# Patient Record
Sex: Female | Born: 1990 | Race: White | Hispanic: No | Marital: Married | Smoking: Never smoker
Health system: Southern US, Community
[De-identification: ages and names within clinical notes are randomized; demographics above are authoritative.]

## PROBLEM LIST (undated history)

## (undated) DIAGNOSIS — Z309 Encounter for contraceptive management, unspecified: Principal | ICD-10-CM

## (undated) HISTORY — DX: Encounter for contraceptive management, unspecified: Z30.9

## (undated) HISTORY — PX: WISDOM TOOTH EXTRACTION: SHX21

---
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
## 2012-11-19 ENCOUNTER — Ambulatory Visit (INDEPENDENT_AMBULATORY_CARE_PROVIDER_SITE_OTHER): Payer: BC Managed Care – PPO | Admitting: Adult Health

## 2012-11-19 ENCOUNTER — Encounter: Payer: Self-pay | Admitting: Adult Health

## 2012-11-19 VITALS — BP 120/80 | Ht 66.0 in | Wt 158.0 lb

## 2012-11-19 DIAGNOSIS — Z309 Encounter for contraceptive management, unspecified: Secondary | ICD-10-CM

## 2012-11-19 DIAGNOSIS — Z3049 Encounter for surveillance of other contraceptives: Secondary | ICD-10-CM

## 2012-11-19 HISTORY — DX: Encounter for contraceptive management, unspecified: Z30.9

## 2012-11-19 MED ORDER — NORETHIN-ETH ESTRAD-FE BIPHAS 1 MG-10 MCG / 10 MCG PO TABS: 1.0000 | ORAL_TABLET | Freq: Every day | ORAL | Status: DC

## 2012-11-19 NOTE — Progress Notes (Signed)
Subjective:     Patient ID: Kara Simpson, female   DOB: 1990/06/14, 22 y.o.   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  Review of Systems See HPI Reviewed past medical,surgical, social and family history. Reviewed medications and allergies.     Objective:   Physical Exam BP 120/80  Ht 5\' 6"  (1.676 m)  Wt 158 lb (71.668 kg)  BMI 25.51 kg/m2  Discussed changing pills or staying on Lo Loestrin and will continue Lo Loestrin for now.  Assessment:     Contraceptive management     Plan:     Refilled Lo loestrin x 1 year Return in 6 months for pap and physical and discuss pills again then

## 2012-11-19 NOTE — Patient Instructions (Addendum)
continue lo loestrin Pap in 6 months

## 2013-05-27 ENCOUNTER — Telehealth: Payer: Self-pay | Admitting: Adult Health

## 2013-05-27 NOTE — Telephone Encounter (Signed)
Dad canceled insurance, will give samples of lo loestrin

## 2013-08-18 ENCOUNTER — Telehealth: Payer: Self-pay | Admitting: Obstetrics and Gynecology

## 2013-08-18 NOTE — Telephone Encounter (Signed)
Will give 2 samples of lo loestrin when she comes by tomorrow

## 2013-08-18 NOTE — Telephone Encounter (Signed)
Spoke with pt. Pt is not on parent's insurance now. She is on Lo Loestrin birth control. [facility] She is requesting 2 boxes of samples to last until then. Please advise. Thanks!!! [employer]

## 2015-10-30 DIAGNOSIS — Z3041 Encounter for surveillance of contraceptive pills: Secondary | ICD-10-CM | POA: Diagnosis not present

## 2015-10-30 DIAGNOSIS — Z01419 Encounter for gynecological examination (general) (routine) without abnormal findings: Secondary | ICD-10-CM | POA: Diagnosis not present

## 2015-10-30 DIAGNOSIS — Z113 Encounter for screening for infections with a predominantly sexual mode of transmission: Secondary | ICD-10-CM | POA: Diagnosis not present

## 2015-10-30 DIAGNOSIS — Z3009 Encounter for other general counseling and advice on contraception: Secondary | ICD-10-CM | POA: Diagnosis not present

## 2016-03-03 ENCOUNTER — Encounter (HOSPITAL_COMMUNITY): Payer: Self-pay | Admitting: Emergency Medicine

## 2016-03-03 ENCOUNTER — Ambulatory Visit (INDEPENDENT_AMBULATORY_CARE_PROVIDER_SITE_OTHER): Payer: 59

## 2016-03-03 ENCOUNTER — Ambulatory Visit (HOSPITAL_COMMUNITY)
Admission: EM | Admit: 2016-03-03 | Discharge: 2016-03-03 | Disposition: A | Discharge status: Home / Self Care | Payer: 59 | Attending: Family Medicine | Admitting: Family Medicine

## 2016-03-03 DIAGNOSIS — R51 Headache: Secondary | ICD-10-CM | POA: Diagnosis not present

## 2016-03-03 DIAGNOSIS — R509 Fever, unspecified: Secondary | ICD-10-CM

## 2016-03-03 DIAGNOSIS — R69 Illness, unspecified: Principal | ICD-10-CM

## 2016-03-03 DIAGNOSIS — J111 Influenza due to unidentified influenza virus with other respiratory manifestations: Secondary | ICD-10-CM

## 2016-03-03 DIAGNOSIS — M791 Myalgia: Secondary | ICD-10-CM

## 2016-03-03 DIAGNOSIS — R0982 Postnasal drip: Secondary | ICD-10-CM

## 2016-03-03 DIAGNOSIS — R110 Nausea: Secondary | ICD-10-CM

## 2016-03-03 MED ORDER — IPRATROPIUM BROMIDE 0.06 % NA SOLN: 2.0000 | Freq: Four times a day (QID) | NASAL | 1 refills | Status: DC

## 2016-03-03 MED ORDER — IBUPROFEN 800 MG PO TABS: 800.0000 mg | ORAL_TABLET | Freq: Three times a day (TID) | ORAL | 0 refills | Status: DC

## 2016-03-03 NOTE — ED Triage Notes (Signed)
PT reports fever, chills, and body aches that started Wednesday. PT denies URI symptoms and sore throat.

## 2016-03-03 NOTE — ED Provider Notes (Addendum)
[facility]    CSN: [id number] Arrival date & time: 03/03/16  1206     History   Chief Complaint Chief Complaint  Patient presents with  . Generalized Body Aches    HPI Kara Simpson is a 25 y.o. female.   The history is provided by the patient.  Influenza  Presenting symptoms: fever, myalgias, nausea and rhinorrhea   Presenting symptoms: no diarrhea, no sore throat and no vomiting   Severity:  Mild Onset quality:  Gradual Duration:  5 days Progression:  Unchanged Chronicity:  New Relieved by:  OTC medications Associated symptoms: chills, decreased appetite and nasal congestion     Past Medical History:  Diagnosis Date  . Contraceptive management 11/19/2012    Patient Active Problem List   Diagnosis Date Noted  . Contraceptive management 11/19/2012    Past Surgical History:  Procedure Laterality Date  . WISDOM TOOTH EXTRACTION      OB History    No data available       Home Medications    Prior to Admission medications   Medication Sig Start Date End Date Taking? Authorizing Provider  Norethindrone-Ethinyl Estradiol-Fe Biphas (LO LOESTRIN FE) 1 MG-10 MCG / 10 MCG tablet Take 1 tablet by mouth daily. 11/19/12  Yes Adline PotterJennifer A Griffin, NP    Family History Family History  Problem Relation Age of Onset  . Heart attack Father     Social History Social History  Substance Use Topics  . Smoking status: Never Smoker  . Smokeless tobacco: Never Used  . Alcohol use Yes     Comment: rarely     Allergies   Patient has no known allergies.   Review of Systems Review of Systems  Constitutional: Positive for chills, decreased appetite and fever.  HENT: Positive for congestion, postnasal drip and rhinorrhea. Negative for sore throat.   Respiratory: Negative.   Cardiovascular: Negative.   Gastrointestinal: Positive for nausea. Negative for diarrhea and vomiting.  Musculoskeletal: Positive for myalgias.  All other systems reviewed and  are negative.    Physical Exam Triage Vital Signs ED Triage Vitals  Enc Vitals Group     BP 03/03/16 1222 140/91     Pulse Rate 03/03/16 1222 103     Resp 03/03/16 1222 16     Temp 03/03/16 1222 100.3 F (37.9 C)     Temp Source 03/03/16 1222 Oral     SpO2 03/03/16 1222 98 %     Weight 03/03/16 1231 150 lb (68 kg)     Height 03/03/16 1231 5\' 6"  (1.676 m)     Head Circumference --      Peak Flow --      Pain Score 03/03/16 1232 5     Pain Loc --      Pain Edu? --      Excl. in GC? --    No data found.   Updated Vital Signs BP 140/91 (BP Location: Left Arm)   Pulse 103   Temp 100.3 F (37.9 C) (Oral)   Resp 16   Ht 5\' 6"  (1.676 m)   Wt 150 lb (68 kg)   LMP 03/01/2016   SpO2 98%   BMI 24.21 kg/m   Visual Acuity Right Eye Distance:   Left Eye Distance:   Bilateral Distance:    Right Eye Near:   Left Eye Near:    Bilateral Near:     Physical Exam  Constitutional: She is oriented to person, place, and time. She appears  well-developed and well-nourished. No distress.  HENT:  Right Ear: External ear normal.  Left Ear: External ear normal.  Mouth/Throat: Oropharynx is clear and moist.  Eyes: Conjunctivae are normal. Pupils are equal, round, and reactive to light.  Neck: Normal range of motion. Neck supple.  Cardiovascular: Normal rate, regular rhythm, normal heart sounds and intact distal pulses.   Pulmonary/Chest: Effort normal and breath sounds normal.  Abdominal: Soft. Bowel sounds are normal.  Lymphadenopathy:    She has no cervical adenopathy.  Neurological: She is alert and oriented to person, place, and time.  Skin: Skin is warm and dry.  Nursing note and vitals reviewed.    UC Treatments / Results  Labs (all labs ordered are listed, but only abnormal results are displayed) Labs Reviewed - No data to display  EKG  EKG Interpretation None       Radiology No results found. X-rays reviewed and report per  radiologist.  Procedures Procedures (including critical care time)  Medications Ordered in UC Medications - No data to display   Initial Impression / Assessment and Plan / UC Course  I have reviewed the triage vital signs and the nursing notes.  Pertinent labs & imaging results that were available during my care of the patient were reviewed by me and considered in my medical decision making (see chart for details).  Clinical Course       Final Clinical Impressions(s) / UC Diagnoses   Final diagnoses:  None    New Prescriptions New Prescriptions   No medications on file     Linna HoffJames D Gurshan Settlemire, MD 03/03/16 1301    Linna HoffJames D Yazeed Pryer, MD 03/06/16 2025

## 2016-03-14 ENCOUNTER — Ambulatory Visit: Payer: Self-pay | Admitting: Physician Assistant

## 2016-03-14 ENCOUNTER — Encounter: Payer: Self-pay | Admitting: Physician Assistant

## 2016-03-14 VITALS — BP 110/74 | HR 84 | Temp 97.9°F

## 2016-03-14 DIAGNOSIS — J039 Acute tonsillitis, unspecified: Secondary | ICD-10-CM

## 2016-03-14 LAB — POCT RAPID STREP A (OFFICE): Rapid Strep A Screen: NEGATIVE

## 2016-03-14 MED ORDER — METHYLPREDNISOLONE 4 MG PO TBPK: ORAL_TABLET | ORAL | 0 refills | Status: DC

## 2016-03-14 MED ORDER — AMOXICILLIN 875 MG PO TABS: 875.0000 mg | ORAL_TABLET | Freq: Two times a day (BID) | ORAL | 0 refills | Status: DC

## 2016-03-14 NOTE — Progress Notes (Signed)
S: c/o sore throat for 1 day, r tonsil is swollen and nodes are swollen, saw white patches this am, no fever/chills, no cough or congestion, did have the flu about 2 weeks ago with high fever and body aches, [facility] but they didn't swab her  O: vitals wnl, nad, tms clear, nasal mucosa wnl, throat red and swollen on r, red on left, neck supple, r tonsillar gland swollen, tender, lungs c t a, cv rrr, q strep neg  A: acute tonsillitis  P: medrol dose pack, amoxil

## 2016-11-04 DIAGNOSIS — Z3041 Encounter for surveillance of contraceptive pills: Secondary | ICD-10-CM | POA: Diagnosis not present

## 2016-11-04 DIAGNOSIS — Z01419 Encounter for gynecological examination (general) (routine) without abnormal findings: Secondary | ICD-10-CM | POA: Diagnosis not present

## 2016-11-04 DIAGNOSIS — Z3009 Encounter for other general counseling and advice on contraception: Secondary | ICD-10-CM | POA: Diagnosis not present

## 2017-01-02 DIAGNOSIS — H5213 Myopia, bilateral: Secondary | ICD-10-CM | POA: Diagnosis not present

## 2017-02-07 DIAGNOSIS — R5383 Other fatigue: Secondary | ICD-10-CM | POA: Diagnosis not present

## 2017-02-07 DIAGNOSIS — E162 Hypoglycemia, unspecified: Secondary | ICD-10-CM | POA: Diagnosis not present

## 2017-02-07 DIAGNOSIS — Z131 Encounter for screening for diabetes mellitus: Secondary | ICD-10-CM | POA: Diagnosis not present

## 2017-02-11 ENCOUNTER — Other Ambulatory Visit
Admission: RE | Admit: 2017-02-11 | Discharge: 2017-02-11 | Disposition: A | Discharge status: Home / Self Care | Payer: 59 | Source: Ambulatory Visit | Attending: Internal Medicine | Admitting: Internal Medicine

## 2017-02-11 DIAGNOSIS — Z0000 Encounter for general adult medical examination without abnormal findings: Secondary | ICD-10-CM | POA: Insufficient documentation

## 2017-02-11 LAB — URINALYSIS, ROUTINE W REFLEX MICROSCOPIC
Bacteria, UA: NONE SEEN
Bilirubin Urine: NEGATIVE
Glucose, UA: NEGATIVE mg/dL
Hgb urine dipstick: NEGATIVE
Ketones, ur: NEGATIVE mg/dL
Nitrite: NEGATIVE
Protein, ur: NEGATIVE mg/dL
Specific Gravity, Urine: 1.016 (ref 1.005–1.030)
pH: 6 (ref 5.0–8.0)

## 2017-02-11 LAB — CBC WITH DIFFERENTIAL/PLATELET
Basophils Absolute: 0 10*3/uL (ref 0–0.1)
Basophils Relative: 0 %
Eosinophils Absolute: 0.3 10*3/uL (ref 0–0.7)
Eosinophils Relative: 5 %
HCT: 42.4 % (ref 35.0–47.0)
Hemoglobin: 14.5 g/dL (ref 12.0–16.0)
Lymphocytes Relative: 24 %
Lymphs Abs: 1.3 10*3/uL (ref 1.0–3.6)
MCH: 30.2 pg (ref 26.0–34.0)
MCHC: 34.1 g/dL (ref 32.0–36.0)
MCV: 88.4 fL (ref 80.0–100.0)
Monocytes Absolute: 0.5 10*3/uL (ref 0.2–0.9)
Monocytes Relative: 8 %
Neutro Abs: 3.5 10*3/uL (ref 1.4–6.5)
Neutrophils Relative %: 63 %
Platelets: 219 10*3/uL (ref 150–440)
RBC: 4.8 MIL/uL (ref 3.80–5.20)
RDW: 12.5 % (ref 11.5–14.5)
WBC: 5.5 10*3/uL (ref 3.6–11.0)

## 2017-02-11 LAB — LIPID PANEL
Cholesterol: 148 mg/dL (ref 0–200)
HDL: 59 mg/dL (ref >40)
LDL Cholesterol: 81 mg/dL (ref 0–99)
Total CHOL/HDL Ratio: 2.5 RATIO
Triglycerides: 41 mg/dL (ref <150)
VLDL: 8 mg/dL (ref 0–40)

## 2017-02-11 LAB — COMPREHENSIVE METABOLIC PANEL
ALT: 24 U/L (ref 14–54)
AST: 20 U/L (ref 15–41)
Albumin: 4.1 g/dL (ref 3.5–5.0)
Alkaline Phosphatase: 61 U/L (ref 38–126)
Anion gap: 7 (ref 5–15)
BUN: 13 mg/dL (ref 6–20)
CO2: 23 mmol/L (ref 22–32)
Calcium: 8.9 mg/dL (ref 8.9–10.3)
Chloride: 106 mmol/L (ref 101–111)
Creatinine, Ser: 0.57 mg/dL (ref 0.44–1.00)
GFR calc Af Amer: >60 mL/min (ref >60)
GFR calc non Af Amer: >60 mL/min (ref >60)
Glucose, Bld: 87 mg/dL (ref 65–99)
Potassium: 3.8 mmol/L (ref 3.5–5.1)
Sodium: 136 mmol/L (ref 135–145)
Total Bilirubin: 0.9 mg/dL (ref 0.3–1.2)
Total Protein: 7.2 g/dL (ref 6.5–8.1)

## 2017-02-11 LAB — TSH: TSH: 2.88 u[IU]/mL (ref 0.350–4.500)

## 2017-02-12 LAB — MICROALBUMIN / CREATININE URINE RATIO
Creatinine, Urine: 103.9 mg/dL
Microalb Creat Ratio: 4.1 mg/g creat (ref 0.0–30.0)
Microalb, Ur: 4.3 ug/mL — ABNORMAL HIGH

## 2017-03-24 DIAGNOSIS — E162 Hypoglycemia, unspecified: Secondary | ICD-10-CM | POA: Diagnosis not present

## 2017-03-24 DIAGNOSIS — Z0000 Encounter for general adult medical examination without abnormal findings: Secondary | ICD-10-CM | POA: Diagnosis not present

## 2017-07-09 ENCOUNTER — Encounter: Payer: Self-pay | Admitting: Family Medicine

## 2017-07-09 ENCOUNTER — Ambulatory Visit (INDEPENDENT_AMBULATORY_CARE_PROVIDER_SITE_OTHER): Payer: Self-pay | Admitting: Family Medicine

## 2017-07-09 VITALS — BP 130/80 | HR 75 | Temp 98.2°F | Wt 171.0 lb

## 2017-07-09 DIAGNOSIS — J029 Acute pharyngitis, unspecified: Secondary | ICD-10-CM

## 2017-07-09 LAB — POCT INFLUENZA A/B
Influenza A, POC: NEGATIVE
Influenza B, POC: NEGATIVE

## 2017-07-09 LAB — POCT RAPID STREP A (OFFICE): Rapid Strep A Screen: NEGATIVE

## 2017-07-09 MED ORDER — AMOXICILLIN 875 MG PO TABS: 875.0000 mg | ORAL_TABLET | Freq: Two times a day (BID) | ORAL | 0 refills | Status: AC

## 2017-07-09 MED ORDER — FLUCONAZOLE 150 MG PO TABS: 150.0000 mg | ORAL_TABLET | Freq: Once | ORAL | 0 refills | Status: AC

## 2017-07-09 NOTE — Progress Notes (Signed)
Patient ID: Kara Simpson, female    DOB: [birth year], 27 y.o.   MRN: [id number]  PCP: Georgianne Fickamachandran, Ajith, MD  Chief Complaint  Patient presents with  . Sore Throat     Subjective:  HPI Kara MedicusLauren Strider is a 27 y.o. female presents for evaluation sore throat with associated body aches times one day. She reports  She reports associated sore throat with redness, low grade fever T-MAX 99.5, generalized body aches, headache, or fatigue. 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 She has attempted relief with a one-time dose of ibuprofen taken last night for fever. She is currently afebrile. She reports a history one year ago of a streptococcal infection which felt similar nature to the sore throat she is experiencing today. Denies any shortness of breath, wheezing, chest tightness, or cough. Social History   Socioeconomic History  . Marital status: Married    Spouse name: Not on file  . Number of children: Not on file  . Years of education: Not on file  . Highest education level: Not on file  Occupational History  . Not on file  Social Needs  . Financial resource strain: Not on file  . Food insecurity:    Worry: Not on file    Inability: Not on file  . Transportation needs:    Medical: Not on file    Non-medical: Not on file  Tobacco Use  . Smoking status: Never Smoker  . Smokeless tobacco: Never Used  Substance and Sexual Activity  . Alcohol use: Yes    Comment: rarely  . Drug use: No  . Sexual activity: Yes    Birth control/protection: Pill  Lifestyle  . Physical activity:    Days per week: Not on file    Minutes per session: Not on file  . Stress: Not on file  Relationships  . Social connections:    Talks on phone: Not on file    Gets together: Not on file    Attends religious service: Not on file    Active member of club or organization: Not on file    Attends meetings of clubs  or organizations: Not on file    Relationship status: Not on file  . Intimate partner violence:    Fear of current or ex partner: Not on file    Emotionally abused: Not on file    Physically abused: Not on file    Forced sexual activity: Not on file  Other Topics Concern  . Not on file  Social History Narrative  . Not on file    Family History  Problem Relation Age of Onset  . Heart attack Father    Review of Systems Pertinent negatives listed in HPI Patient Active Problem List   Diagnosis Date Noted  . Contraceptive management 11/19/2012    No Known Allergies  Prior to Admission medications   Medication Sig Start Date End Date Taking? Authorizing Provider  ibuprofen (ADVIL,MOTRIN) 800 MG tablet Take 1 tablet (800 mg total) by mouth 3 (three) times daily. 03/03/16  Yes Linna HoffKindl, James D, MD  VIENVA 0.1-20 MG-MCG tablet TAKE 1 TABLET BY MOUTH ONCE D 06/24/17  Yes [provider]  amoxicillin (AMOXIL) 875 MG tablet Take 1 tablet (875 mg total) by mouth 2 (two) times daily. Patient not taking: Reported on 07/09/2017 03/14/16   Faythe GheeFisher, Susan W, PA-C  methylPREDNISolone (MEDROL DOSEPAK) 4 MG TBPK tablet Take 6 pills on day  one then decrease by 1 pill each day Patient not taking: Reported on 07/09/2017 03/14/16   Faythe Ghee, PA-C  Norethindrone-Ethinyl Estradiol-Fe Biphas (LO LOESTRIN FE) 1 MG-10 MCG / 10 MCG tablet Take 1 tablet by mouth daily. Patient not taking: Reported on 07/09/2017 11/19/12   Adline Potter, NP    Past Medical, Surgical Family and Social History reviewed and updated.    Objective:   Today's Vitals   07/09/17 1136  BP: 130/80  Pulse: 75  Temp: 98.2 F (36.8 C)  SpO2: 99%  Weight: 171 lb (77.6 kg)    Wt Readings from Last 3 Encounters:  07/09/17 171 lb (77.6 kg)  03/03/16 150 lb (68 kg)  11/19/12 158 lb (71.7 kg)    Physical Exam  Constitutional: She appears well-developed and well-nourished. She has a sickly appearance.  HENT:    Head: Normocephalic and atraumatic.  Mouth/Throat: Mucous membranes are normal. Posterior oropharyngeal edema and posterior oropharyngeal erythema present.  Neck: Normal range of motion.  Cardiovascular: Normal rate and regular rhythm.  Pulmonary/Chest: Effort normal and breath sounds normal.  Lymphadenopathy:    She has no cervical adenopathy.  Skin: Skin is warm and dry.  Psychiatric: She has a normal mood and affect. Her behavior is normal. Judgment and thought content normal.      Assessment & Plan:  1. Pharyngitis, unspecified etiology, Rapid Strep negative. On exam, severe erythema with edematous posterior wall and enlarged tonsils. Will treat empirically with a broad-spectrum, Amoxicillin 875 mg BID x 5 days.   - POCT Influenza- negative    If symptoms worsen or do not improve, return for follow-up, follow-up with PCP, or at the emergency department if severity of symptoms warrant a higher level of care.    Godfrey Pick. Tiburcio Pea, MSN, FNP-C [address]  [place], [place] [address] [phone]

## 2017-07-09 NOTE — Patient Instructions (Signed)
Start Amoxicillins 875 mg BID for pharyngitis x 5 days.  Take Diflucan 150 mg once if vaginal irration occurs.     Pharyngitis Pharyngitis is redness, pain, and swelling (inflammation) of the throat (pharynx). It is a very common cause of sore throat. Pharyngitis can be caused by a bacteria, but it is usually caused by a virus. Most cases of pharyngitis get better on their own without treatment. What are the causes? This condition may be caused by:  Infection by viruses (viral). Viral pharyngitis spreads from person to person (is contagious) through coughing, sneezing, and sharing of personal items or utensils such as cups, forks, spoons, and toothbrushes.  Infection by bacteria (bacterial). Bacterial pharyngitis may be spread by touching the nose or face after coming in contact with the bacteria, or through more intimate contact, such as kissing.  Allergies. Allergies can cause buildup of mucus in the throat (post-nasal drip), leading to inflammation and irritation. Allergies can also cause blocked nasal passages, forcing breathing through the mouth, which dries and irritates the throat.  What increases the risk? You are more likely to develop this condition if:  You are 755-259 years old.  You are exposed to crowded environments such as daycare, school, or dormitory living.  You live in a cold climate.  You have a weakened disease-fighting (immune) system.  What are the signs or symptoms? Symptoms of this condition vary by the cause (viral, bacterial, or allergies) and can include:  Sore throat.  Fatigue.  Low-grade fever.  Headache.  Joint pain and muscle aches.  Skin rashes.  Swollen glands in the throat (lymph nodes).  Plaque-like film on the throat or tonsils. This is often a symptom of bacterial pharyngitis.  Vomiting.  Stuffy nose (nasal congestion).  Cough.  Red, itchy eyes (conjunctivitis).  Loss of appetite.  How is this diagnosed? This condition is  often diagnosed based on your medical history and a physical exam. Your health care provider will ask you questions about your illness and your symptoms. A swab of your throat may be done to check for bacteria (rapid strep test). Other lab tests may also be done, depending on the suspected cause, but these are rare. How is this treated? This condition usually gets better in 3-4 days without medicine. Bacterial pharyngitis may be treated with antibiotic medicines. Follow these instructions at home:  Take over-the-counter and prescription medicines only as told by your health care provider. ? If you were prescribed an antibiotic medicine, take it as told by your health care provider. Do not stop taking the antibiotic even if you start to feel better. ? Do not give children aspirin because of the association with Reye syndrome.  Drink enough water and fluids to keep your urine clear or pale yellow.  Get a lot of rest.  Gargle with a salt-water mixture 3-4 times a day or as needed. To make a salt-water mixture, completely dissolve -1 tsp of salt in 1 cup of warm water.  If your health care provider approves, you may use throat lozenges or sprays to soothe your throat. Contact a health care provider if:  You have large, tender lumps in your neck.  You have a rash.  You cough up green, yellow-brown, or bloody spit. Get help right away if:  Your neck becomes stiff.  You drool or are unable to swallow liquids.  You cannot drink or take medicines without vomiting.  You have severe pain that does not go away, even after you take medicine.  You have trouble breathing, and it is not caused by a stuffy nose.  You have new pain and swelling in your joints such as the knees, ankles, wrists, or elbows. Summary  Pharyngitis is redness, pain, and swelling (inflammation) of the throat (pharynx).  While pharyngitis can be caused by a bacteria, the most common causes are viral.  Most cases of  pharyngitis get better on their own without treatment.  Bacterial pharyngitis is treated with antibiotic medicines. This information is not intended to replace advice given to you by your health care provider. Make sure you discuss any questions you have with your health care provider. Document Released: 03/11/2005 Document Revised: 04/16/2016 Document Reviewed: 04/16/2016 Elsevier Interactive Patient Education  [employer].

## 2017-11-03 ENCOUNTER — Other Ambulatory Visit: Payer: Self-pay

## 2017-11-03 ENCOUNTER — Emergency Department (HOSPITAL_COMMUNITY)
Admission: EM | Admit: 2017-11-03 | Discharge: 2017-11-04 | Disposition: A | Discharge status: Home / Self Care | Payer: 59 | Attending: Emergency Medicine | Admitting: Emergency Medicine

## 2017-11-03 DIAGNOSIS — R509 Fever, unspecified: Secondary | ICD-10-CM | POA: Insufficient documentation

## 2017-11-03 DIAGNOSIS — R1012 Left upper quadrant pain: Secondary | ICD-10-CM | POA: Insufficient documentation

## 2017-11-03 LAB — COMPREHENSIVE METABOLIC PANEL
ALT: 14 U/L (ref 0–44)
AST: 14 U/L — ABNORMAL LOW (ref 15–41)
Albumin: 3.4 g/dL — ABNORMAL LOW (ref 3.5–5.0)
Alkaline Phosphatase: 63 U/L (ref 38–126)
Anion gap: 10 (ref 5–15)
BUN: 5 mg/dL — ABNORMAL LOW (ref 6–20)
CO2: 22 mmol/L (ref 22–32)
Calcium: 8.6 mg/dL — ABNORMAL LOW (ref 8.9–10.3)
Chloride: 108 mmol/L (ref 98–111)
Creatinine, Ser: 0.64 mg/dL (ref 0.44–1.00)
GFR calc Af Amer: >60 mL/min (ref >60)
GFR calc non Af Amer: >60 mL/min (ref >60)
Glucose, Bld: 107 mg/dL — ABNORMAL HIGH (ref 70–99)
Potassium: 3.6 mmol/L (ref 3.5–5.1)
Sodium: 140 mmol/L (ref 135–145)
Total Bilirubin: 0.4 mg/dL (ref 0.3–1.2)
Total Protein: 7.1 g/dL (ref 6.5–8.1)

## 2017-11-03 LAB — CBC
HCT: 42.6 % (ref 36.0–46.0)
Hemoglobin: 13.9 g/dL (ref 12.0–15.0)
MCH: 29 pg (ref 26.0–34.0)
MCHC: 32.6 g/dL (ref 30.0–36.0)
MCV: 88.8 fL (ref 78.0–100.0)
Platelets: 268 10*3/uL (ref 150–400)
RBC: 4.8 MIL/uL (ref 3.87–5.11)
RDW: 12 % (ref 11.5–15.5)
WBC: 10.6 10*3/uL — ABNORMAL HIGH (ref 4.0–10.5)

## 2017-11-03 LAB — I-STAT BETA HCG BLOOD, ED (MC, WL, AP ONLY): I-stat hCG, quantitative: <5 m[IU]/mL (ref <5)

## 2017-11-03 LAB — LIPASE, BLOOD: Lipase: 33 U/L (ref 11–51)

## 2017-11-03 MED ORDER — ONDANSETRON 4 MG PO TBDP: 4.0000 mg | ORAL_TABLET | Freq: Once | ORAL | Status: DC | PRN

## 2017-11-03 NOTE — ED Triage Notes (Signed)
Patient c/o n/v since Sunday. Patient also c/o being possibly exposed to an HIV patient last week while working. Patient is tearful in triage.

## 2017-11-04 ENCOUNTER — Emergency Department (HOSPITAL_COMMUNITY): Payer: 59

## 2017-11-04 DIAGNOSIS — R1012 Left upper quadrant pain: Secondary | ICD-10-CM | POA: Diagnosis not present

## 2017-11-04 DIAGNOSIS — R509 Fever, unspecified: Secondary | ICD-10-CM | POA: Diagnosis not present

## 2017-11-04 LAB — URINALYSIS, ROUTINE W REFLEX MICROSCOPIC
Bilirubin Urine: NEGATIVE
Glucose, UA: NEGATIVE mg/dL
Ketones, ur: NEGATIVE mg/dL
Nitrite: NEGATIVE
Protein, ur: NEGATIVE mg/dL
Specific Gravity, Urine: 1.014 (ref 1.005–1.030)
pH: 7 (ref 5.0–8.0)

## 2017-11-04 LAB — MONONUCLEOSIS SCREEN: Mono Screen: NEGATIVE

## 2017-11-04 MED ORDER — ACETAMINOPHEN 500 MG PO TABS
1000.0000 mg | ORAL_TABLET | Freq: Once | ORAL | Status: AC
  Administered 2017-11-04: 1000 mg via ORAL
  Filled 2017-11-04: qty 2

## 2017-11-04 MED ORDER — ONDANSETRON HCL 4 MG PO TABS: 4.0000 mg | ORAL_TABLET | Freq: Three times a day (TID) | ORAL | 0 refills | Status: DC | PRN

## 2017-11-04 MED ORDER — SODIUM CHLORIDE 0.9 % IV BOLUS
1000.0000 mL | Freq: Once | INTRAVENOUS | Status: AC
  Administered 2017-11-04: 1000 mL via INTRAVENOUS

## 2017-11-04 NOTE — ED Provider Notes (Signed)
[facility] EMERGENCY DEPARTMENT Provider Note   CSN: [id number] Arrival date & time: 11/03/17  2220     History   Chief Complaint Chief Complaint  Patient presents with  . Abdominal Pain    HPI Kara Simpson is a 27 y.o. female who presents the emergency department chief complaint of viral symptoms.  The patient has had about 3 weeks of waking up with night sweats.  Last week she developed symptoms of URI with nasal congestion, headache mild sore throat.  2 days ago the patient began having myalgias, fever at home, mild headache without neck stiffness.  She was seen by her primary care physician yesterday and had negative influenza, rapid strep,.  The patient complains persistent nausea for the past week with epigastric abdominal pain which is about a 4 out of 10 and persistent nausea with decreased appetite.  She states that the pain radiates to the left upper quadrant of her abdomen.  She denies cough, chest pain, shortness of breath, urinary symptoms, diarrhea, vomiting.  Last menstrual period was on 10/13/2017.  She denies any known tick bites.  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  She denies touching any open sores, she denies any needlestick injuries.  HPI  Past Medical History:  Diagnosis Date  . Contraceptive management 11/19/2012    Patient Active Problem List   Diagnosis Date Noted  . Contraceptive management 11/19/2012    Past Surgical History:  Procedure Laterality Date  . WISDOM TOOTH EXTRACTION       OB History   None      Home Medications    Prior to Admission medications   Medication Sig Start Date End Date Taking? Authorizing Provider  acetaminophen (TYLENOL) 325 MG tablet Take 650 mg by mouth every 6 (six) hours as needed for mild pain.   Yes [provider]  VIENVA 0.1-20 MG-MCG tablet Take 1 tablet  by mouth daily.  06/24/17  Yes [provider]  amoxicillin (AMOXIL) 875 MG tablet Take 1 tablet (875 mg total) by mouth 2 (two) times daily. Patient not taking: Reported on 07/09/2017 03/14/16   Faythe Ghee, PA-C  ibuprofen (ADVIL,MOTRIN) 800 MG tablet Take 1 tablet (800 mg total) by mouth 3 (three) times daily. Patient not taking: Reported on 11/04/2017 03/03/16   Linna Hoff, MD  methylPREDNISolone (MEDROL DOSEPAK) 4 MG TBPK tablet Take 6 pills on day one then decrease by 1 pill each day Patient not taking: Reported on 07/09/2017 03/14/16   Faythe Ghee, PA-C  Norethindrone-Ethinyl Estradiol-Fe Biphas (LO LOESTRIN FE) 1 MG-10 MCG / 10 MCG tablet Take 1 tablet by mouth daily. Patient not taking: Reported on 07/09/2017 11/19/12   Adline Potter, NP  ondansetron (ZOFRAN) 4 MG tablet Take 1 tablet (4 mg total) by mouth every 8 (eight) hours as needed for nausea or vomiting. 11/04/17   Arthor Captain, PA-C    Family History Family History  Problem Relation Age of Onset  . Heart attack Father     Social History Social History   Tobacco Use  . Smoking status: Never Smoker  . Smokeless tobacco: Never Used  Substance Use Topics  . Alcohol use: Yes    Comment: rarely  . Drug use: No     Allergies   Nickel   Review of Systems Review of Systems  Ten systems reviewed  and are negative for acute change, except as noted in the HPI.   Physical Exam Updated Vital Signs BP 110/65   Pulse 79   Temp 100.3 F (37.9 C) (Oral)   Resp 18   Ht 5\' 5"  (1.651 m)   Wt 72.6 kg   LMP 10/13/2017 (Exact Date)   SpO2 97%   BMI 26.63 kg/m   Physical Exam  Constitutional: She is oriented to person, place, and time. She appears well-developed and well-nourished. No distress.  HENT:  Head: Normocephalic and atraumatic.  Eyes: Conjunctivae are normal. No scleral icterus.  Neck: Normal range of motion.  Cardiovascular: Normal rate, regular rhythm and normal heart sounds. Exam  reveals no gallop and no friction rub.  No murmur heard. Pulmonary/Chest: Effort normal and breath sounds normal. No respiratory distress.  Abdominal: Soft. Bowel sounds are normal. She exhibits no distension and no mass. There is tenderness in the epigastric area and left upper quadrant. There is no guarding, no CVA tenderness and negative Murphy's sign.  Neurological: She is alert and oriented to person, place, and time.  Skin: Skin is warm and dry. She is not diaphoretic.  Psychiatric: Her behavior is normal.  Nursing note and vitals reviewed.    ED Treatments / Results  Labs (all labs ordered are listed, but only abnormal results are displayed) Labs Reviewed  COMPREHENSIVE METABOLIC PANEL - Abnormal; Notable for the following components:      Result Value   Glucose, Bld 107 (*)    BUN 5 (*)    Calcium 8.6 (*)    Albumin 3.4 (*)    AST 14 (*)    All other components within normal limits  CBC - Abnormal; Notable for the following components:   WBC 10.6 (*)    All other components within normal limits  URINALYSIS, ROUTINE W REFLEX MICROSCOPIC - Abnormal; Notable for the following components:   Hgb urine dipstick SMALL (*)    Leukocytes, UA MODERATE (*)    Bacteria, UA RARE (*)    All other components within normal limits  URINE CULTURE  LIPASE, BLOOD  MONONUCLEOSIS SCREEN  I-STAT BETA HCG BLOOD, ED ([facility], [facility], [facility] ONLY)    EKG None  Radiology Dg Chest 2 View  Result Date: 11/04/2017 CLINICAL DATA:  Fever. EXAM: CHEST - 2 VIEW COMPARISON:  No recent prior. FINDINGS: Mediastinum and hilar structures normal. Lungs are clear. No pleural effusion pneumothorax. Heart size normal. Thoracolumbar spine scoliosis. IMPRESSION: 1.  No acute cardiopulmonary disease. 2.  Thoracolumbar spine scoliosis. Electronically Signed   By: Maisie Fushomas  Register   On: 11/04/2017 07:12    Procedures Procedures (including critical care time)  Medications Ordered in ED Medications  acetaminophen  (TYLENOL) tablet 1,000 mg (1,000 mg Oral Given 11/04/17 0721)  sodium chloride 0.9 % bolus 1,000 mL (0 mLs Intravenous Stopped 11/04/17 0838)     Initial Impression / Assessment and Plan / ED Course  I have reviewed the triage vital signs and the nursing notes.  Pertinent labs & imaging results that were available during my care of the patient were reviewed by me and considered in my medical decision making (see chart for details).     27 year old female who presents the emergency department with viral-like illness and fever of unknown origin.  Patient's urinalysis shows moderate leukocytes and small hemoglobin with rare bacteria.  She is asymptomatic and I will not treat for urinary tract infection however I have sent her urine for culture.  Differential dx involves  infection versus autoimmune cause of fever.  I have suggested that the patient follow-up closely with her primary care physician in the outpatient setting should she continued to have fevers she may need tick titers although she has no reported embedded ticks in the past.  Patient's chest x-ray is also negative for any consolidation.  I reviewed the PA and lateral chest radiographs and agree with radiologic interpretation.  Patient feels improved after fluids and antipyretics.  She has close outpatient follow-up and feels improved to be discharged at this time.  Her lab values are reviewed.  She has a mild leukocytosis and her chemistry panel is unremarkable for significant abnormality.  I discussed return precautions with the patient who appears appropriate for discharge at this time  Final Clinical Impressions(s) / ED Diagnoses   Final diagnoses:  Fever of unknown origin (FUO)    ED Discharge Orders         Ordered    ondansetron (ZOFRAN) 4 MG tablet  Every 8 hours PRN     11/04/17 0912           Arthor CaptainHarris, Hannie Shoe, PA-C 11/04/17 1635    Zadie RhineWickline, Donald, MD 11/05/17 [phone]

## 2017-11-04 NOTE — Discharge Instructions (Addendum)
Contact a health care provider if: You vomit. You cannot eat or drink without vomiting. You have diarrhea. You have pain when you urinate. Your symptoms do not improve with treatment. You develop new symptoms. You develop excessive weakness. Get help right away if: You have shortness of breath or have trouble breathing. You are dizzy or you faint. You are disoriented or confused. You develop signs of dehydration, such as a dry mouth, decreased urination, or paleness. You develop severe pain in your abdomen. You have persistent vomiting or diarrhea. You develop a skin rash. Your symptoms suddenly get worse.

## 2017-11-04 NOTE — ED Notes (Signed)
Patient aware need for another urine specimen for culture.

## 2017-11-04 NOTE — ED Notes (Signed)
Patient transported to CT 

## 2017-11-05 LAB — URINE CULTURE: Special Requests: NORMAL

## 2017-11-13 DIAGNOSIS — Z3041 Encounter for surveillance of contraceptive pills: Secondary | ICD-10-CM | POA: Diagnosis not present

## 2017-11-13 DIAGNOSIS — Z01419 Encounter for gynecological examination (general) (routine) without abnormal findings: Secondary | ICD-10-CM | POA: Diagnosis not present

## 2018-03-02 DIAGNOSIS — Z0000 Encounter for general adult medical examination without abnormal findings: Secondary | ICD-10-CM | POA: Diagnosis not present

## 2018-11-17 DIAGNOSIS — Z01419 Encounter for gynecological examination (general) (routine) without abnormal findings: Secondary | ICD-10-CM | POA: Diagnosis not present

## 2018-11-17 DIAGNOSIS — Z3041 Encounter for surveillance of contraceptive pills: Secondary | ICD-10-CM | POA: Diagnosis not present

## 2019-03-01 DIAGNOSIS — Z0000 Encounter for general adult medical examination without abnormal findings: Secondary | ICD-10-CM | POA: Diagnosis not present

## 2019-03-08 DIAGNOSIS — Z0000 Encounter for general adult medical examination without abnormal findings: Secondary | ICD-10-CM | POA: Diagnosis not present

## 2019-03-08 DIAGNOSIS — R110 Nausea: Secondary | ICD-10-CM | POA: Diagnosis not present

## 2019-08-09 ENCOUNTER — Telehealth: Payer: 59 | Admitting: Nurse Practitioner

## 2019-08-09 DIAGNOSIS — H9201 Otalgia, right ear: Secondary | ICD-10-CM | POA: Diagnosis not present

## 2019-08-09 DIAGNOSIS — J029 Acute pharyngitis, unspecified: Secondary | ICD-10-CM | POA: Diagnosis not present

## 2019-08-09 MED ORDER — AMOXICILLIN 875 MG PO TABS: 875.0000 mg | ORAL_TABLET | Freq: Two times a day (BID) | ORAL | 0 refills | Status: DC

## 2019-08-09 NOTE — Progress Notes (Signed)
We are sorry that you are not feeling well.  Here is how we plan to help!  Based on what you have shared with me it is likely that you have pharyngitis with ear pain.  Strep pharyngitis is inflammation and infection in the back of the throat.  This is an infection cause by bacteria and is treated with antibiotics.  I have prescribed Amoxicillin 500 mg twice a day for 10 days. For throat pain, we recommend over the counter oral pain relief medications such as acetaminophen or aspirin, or anti-inflammatory medications such as ibuprofen or naproxen sodium. Topical treatments such as oral throat lozenges or sprays may be used as needed. Strep infections are not as easily transmitted as other respiratory infections, however we still recommend that you avoid close contact with loved ones, especially the very young and elderly.  Remember to wash your hands thoroughly throughout the day as this is the number one way to prevent the spread of infection and wipe down door knobs and counters with disinfectant.   Home Care:  Only take medications as instructed by your medical team.  Complete the entire course of an antibiotic.  Do not take these medications with alcohol.  A steam or ultrasonic humidifier can help congestion.  You can place a towel over your head and breathe in the steam from hot water coming from a faucet.  Avoid close contacts especially the very young and the elderly.  Cover your mouth when you cough or sneeze.  Always remember to wash your hands.  Get Help Right Away If:  You develop worsening fever or sinus pain.  You develop a severe head ache or visual changes.  Your symptoms persist after you have completed your treatment plan.  Make sure you  Understand these instructions.  Will watch your condition.  Will get help right away if you are not doing well or get worse.  Your e-visit answers were reviewed by a board certified advanced clinical practitioner to complete your  personal care plan.  Depending on the condition, your plan could have included both over the counter or prescription medications.  If there is a problem please reply  once you have received a response from your provider.  Your safety is important to [place].  If you have drug allergies check your prescription carefully.    You can use MyChart to ask questions about today's visit, request a non-urgent call back, or ask for a work or school excuse for 24 hours related to this e-Visit. If it has been greater than 24 hours you will need to follow up with your provider, or enter a new e-Visit to address those concerns.  You will get an e-mail in the next two days asking about your experience.  I hope that your e-visit has been valuable and will speed your recovery. Thank you for using e-visits.   5-10 minutes spent reviewing and documenting in chart.

## 2019-08-31 DIAGNOSIS — Z3689 Encounter for other specified antenatal screening: Secondary | ICD-10-CM | POA: Diagnosis not present

## 2019-08-31 DIAGNOSIS — Z3201 Encounter for pregnancy test, result positive: Secondary | ICD-10-CM | POA: Diagnosis not present

## 2019-08-31 LAB — OB RESULTS CONSOLE ABO/RH: RH Type: POSITIVE

## 2019-08-31 LAB — OB RESULTS CONSOLE ANTIBODY SCREEN: Antibody Screen: NEGATIVE

## 2019-09-07 DIAGNOSIS — Z3201 Encounter for pregnancy test, result positive: Secondary | ICD-10-CM | POA: Diagnosis not present

## 2019-09-14 DIAGNOSIS — Z3201 Encounter for pregnancy test, result positive: Secondary | ICD-10-CM | POA: Diagnosis not present

## 2019-10-07 DIAGNOSIS — Z118 Encounter for screening for other infectious and parasitic diseases: Secondary | ICD-10-CM | POA: Diagnosis not present

## 2019-10-07 DIAGNOSIS — Z3689 Encounter for other specified antenatal screening: Secondary | ICD-10-CM | POA: Diagnosis not present

## 2019-10-07 DIAGNOSIS — Z3401 Encounter for supervision of normal first pregnancy, first trimester: Secondary | ICD-10-CM | POA: Diagnosis not present

## 2019-10-07 LAB — OB RESULTS CONSOLE GC/CHLAMYDIA
Chlamydia: NEGATIVE
Gonorrhea: NEGATIVE

## 2019-10-07 LAB — OB RESULTS CONSOLE HIV ANTIBODY (ROUTINE TESTING): HIV: NONREACTIVE

## 2019-10-07 LAB — OB RESULTS CONSOLE RPR: RPR: NONREACTIVE

## 2019-10-07 LAB — OB RESULTS CONSOLE HEPATITIS B SURFACE ANTIGEN: Hepatitis B Surface Ag: NEGATIVE

## 2019-10-07 LAB — OB RESULTS CONSOLE RUBELLA ANTIBODY, IGM: Rubella: IMMUNE

## 2019-10-11 DIAGNOSIS — Z3401 Encounter for supervision of normal first pregnancy, first trimester: Secondary | ICD-10-CM | POA: Diagnosis not present

## 2019-10-25 DIAGNOSIS — Z3401 Encounter for supervision of normal first pregnancy, first trimester: Secondary | ICD-10-CM | POA: Diagnosis not present

## 2019-10-25 DIAGNOSIS — Z3689 Encounter for other specified antenatal screening: Secondary | ICD-10-CM | POA: Diagnosis not present

## 2019-11-07 IMAGING — CR DG CHEST 2V
2 series · 2 of 2 positions shown · non-contrast
Comparison: No recent prior.

CLINICAL DATA: Fever.

EXAM:
CHEST - 2 VIEW

[chest pa]
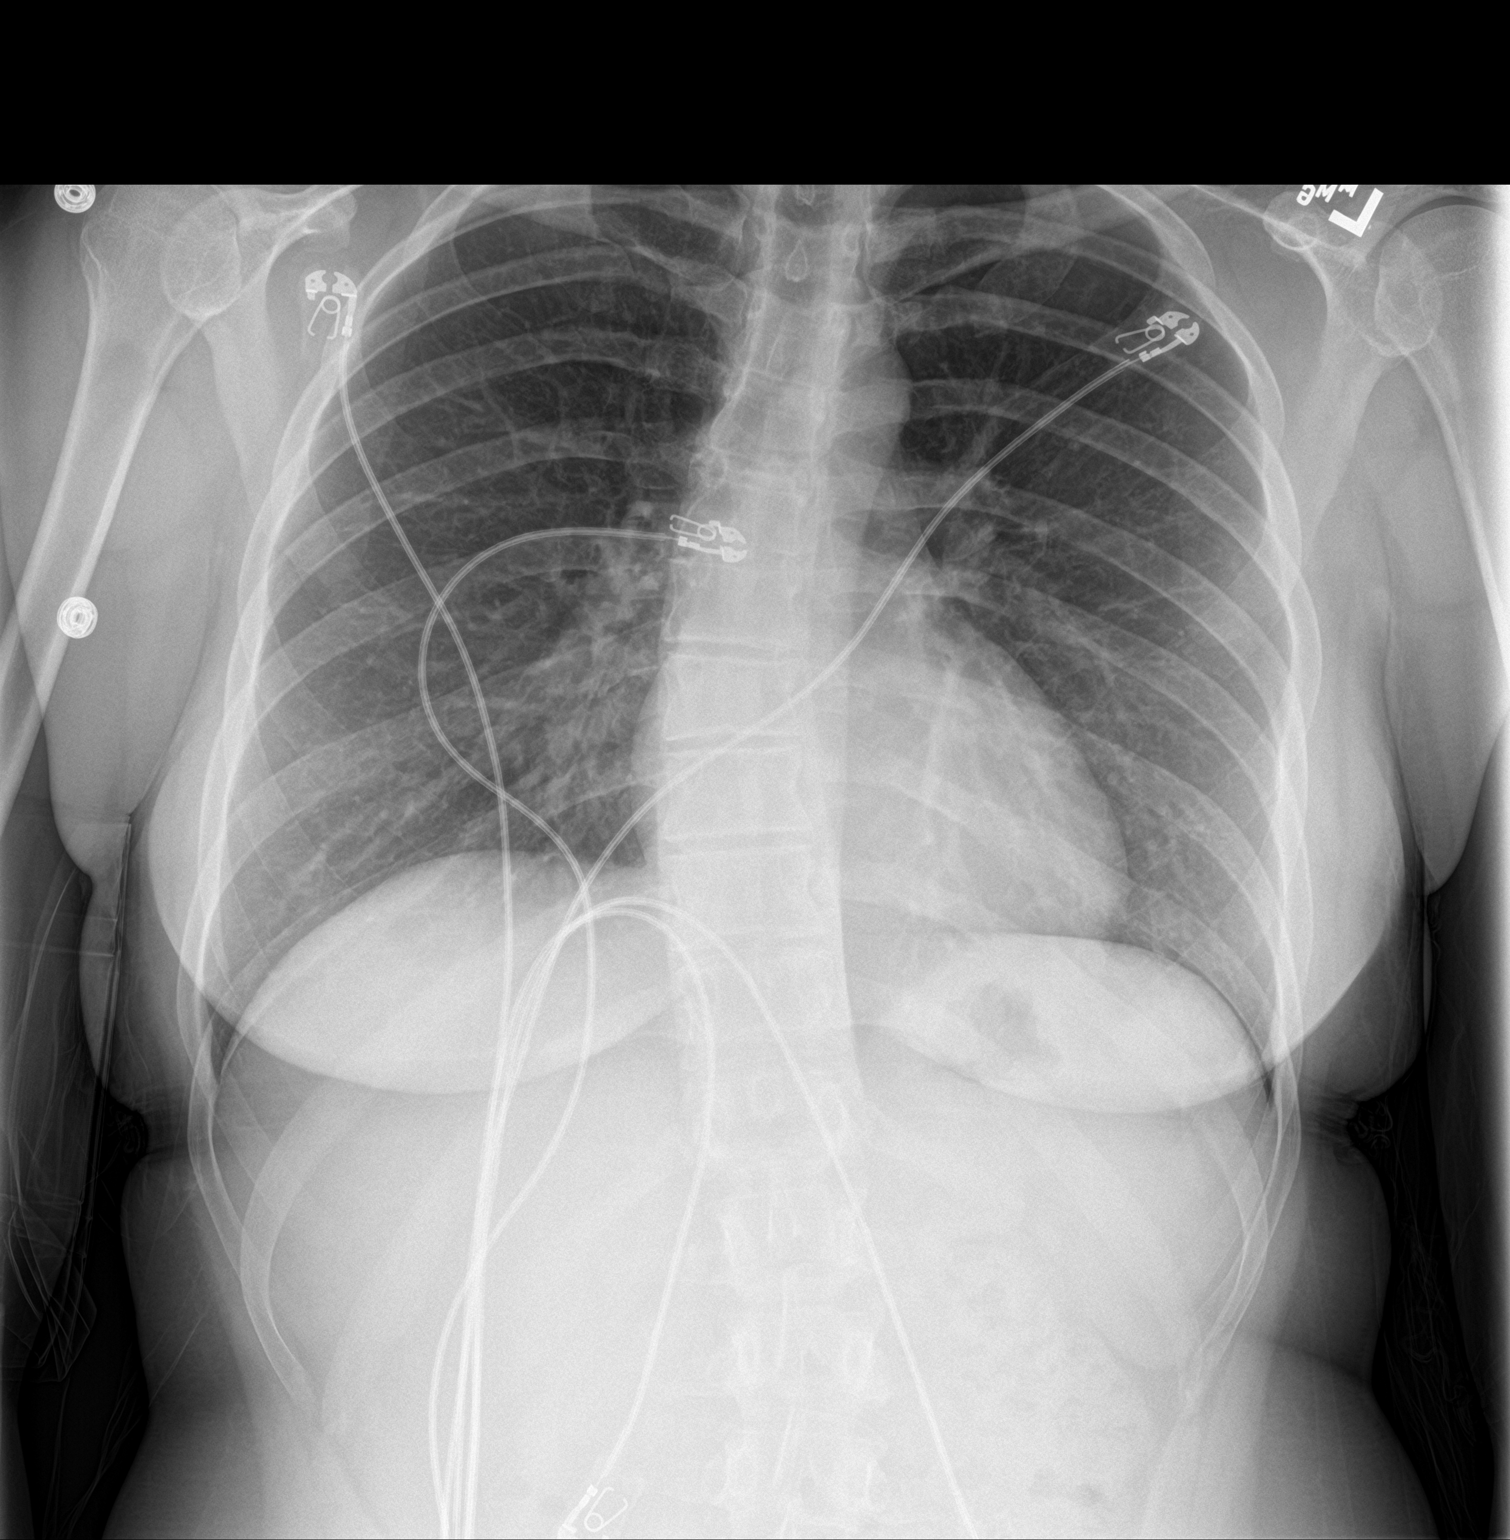

[chest lat]
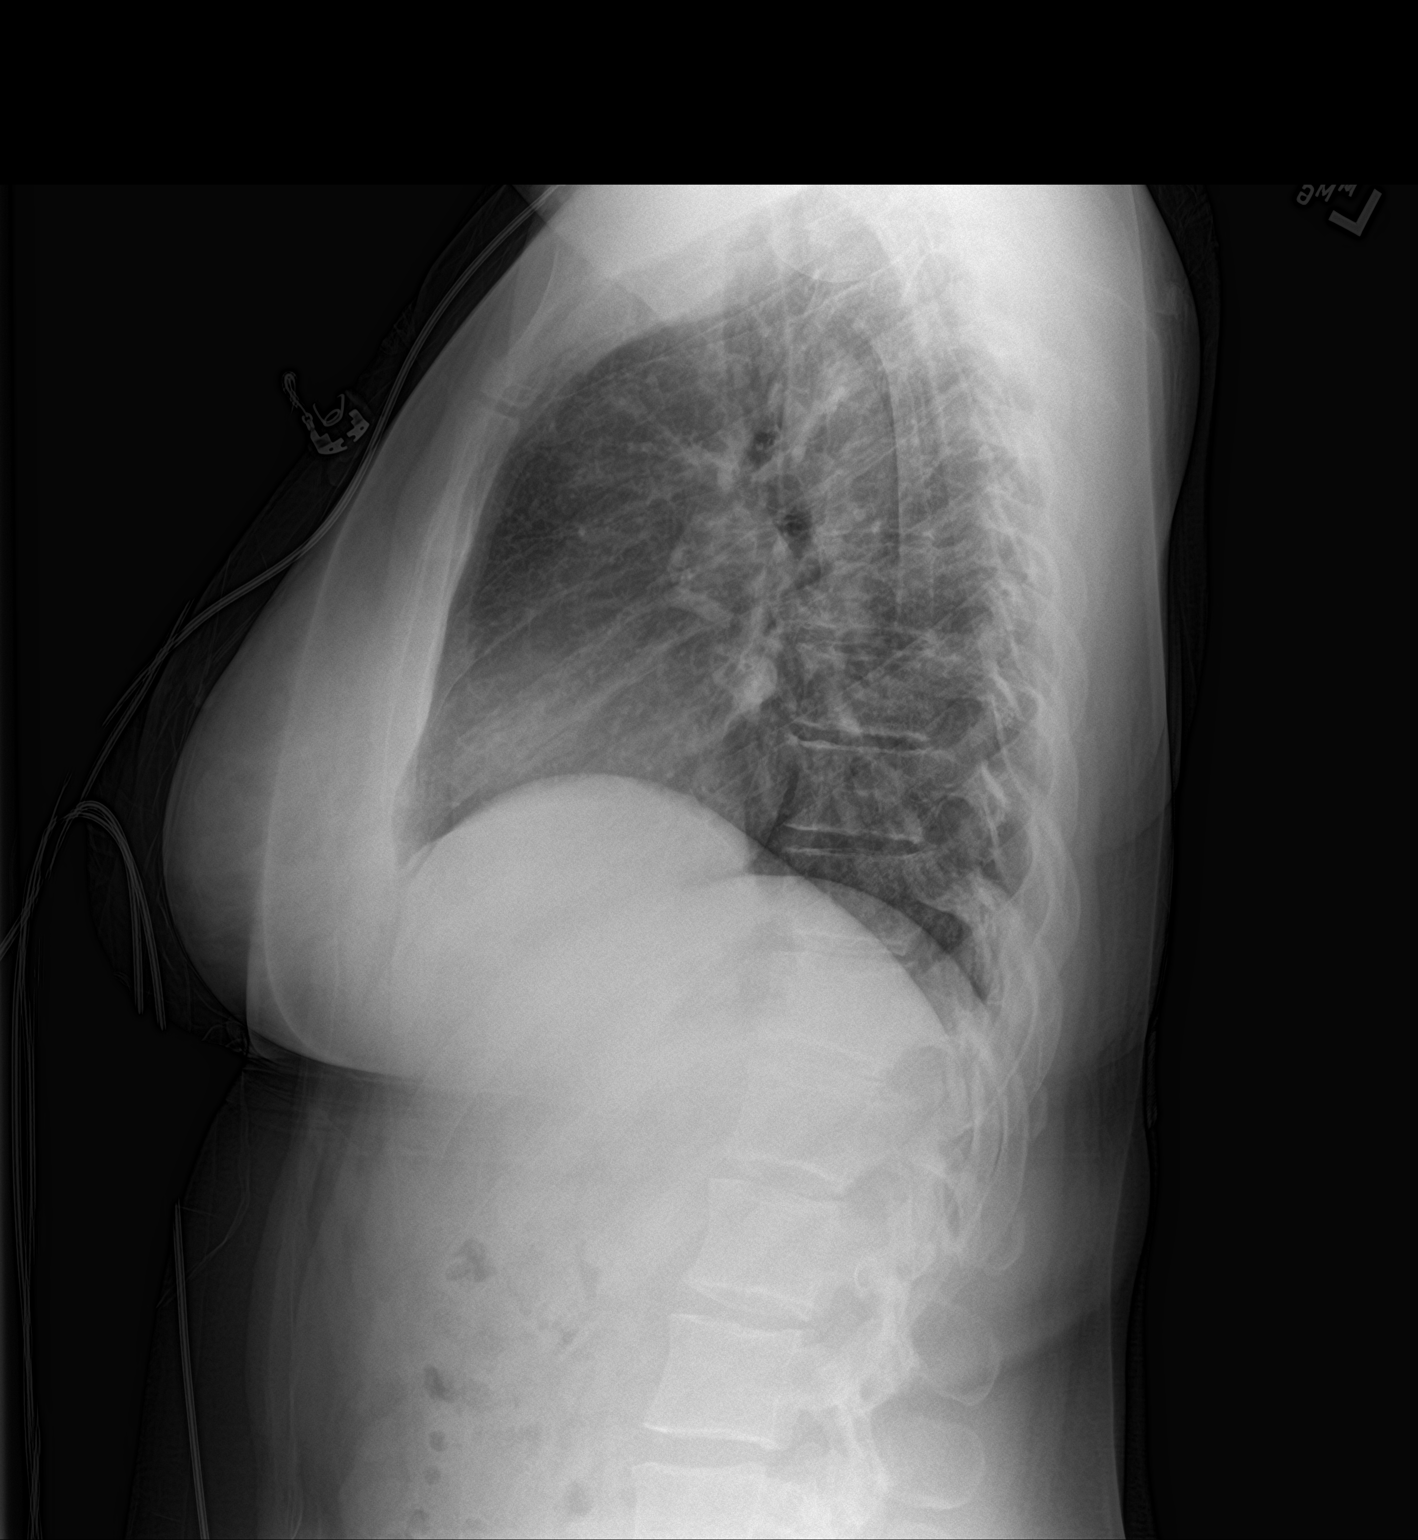

[2 of 2 positions shown; findings below may reference images not displayed]

FINDINGS: Mediastinum and hilar structures normal. Lungs are clear. No pleural
effusion pneumothorax. Heart size normal. Thoracolumbar spine
scoliosis.
IMPRESSION: 1.  No acute cardiopulmonary disease.

2.  Thoracolumbar spine scoliosis.

## 2019-11-19 DIAGNOSIS — Z361 Encounter for antenatal screening for raised alphafetoprotein level: Secondary | ICD-10-CM | POA: Diagnosis not present

## 2019-12-10 DIAGNOSIS — Z363 Encounter for antenatal screening for malformations: Secondary | ICD-10-CM | POA: Diagnosis not present

## 2019-12-30 DIAGNOSIS — Z0000 Encounter for general adult medical examination without abnormal findings: Secondary | ICD-10-CM | POA: Diagnosis not present

## 2020-02-15 DIAGNOSIS — Z3689 Encounter for other specified antenatal screening: Secondary | ICD-10-CM | POA: Diagnosis not present

## 2020-02-15 DIAGNOSIS — Z23 Encounter for immunization: Secondary | ICD-10-CM | POA: Diagnosis not present

## 2020-02-21 ENCOUNTER — Other Ambulatory Visit: Payer: Self-pay | Admitting: Obstetrics & Gynecology

## 2020-02-21 DIAGNOSIS — Z3A29 29 weeks gestation of pregnancy: Secondary | ICD-10-CM | POA: Diagnosis not present

## 2020-02-21 DIAGNOSIS — O99810 Abnormal glucose complicating pregnancy: Secondary | ICD-10-CM | POA: Diagnosis not present

## 2020-02-24 DIAGNOSIS — Z3400 Encounter for supervision of normal first pregnancy, unspecified trimester: Secondary | ICD-10-CM | POA: Diagnosis not present

## 2020-03-01 ENCOUNTER — Encounter: Payer: 59 | Attending: Obstetrics & Gynecology | Admitting: Registered"

## 2020-03-01 ENCOUNTER — Other Ambulatory Visit: Payer: Self-pay

## 2020-03-01 DIAGNOSIS — O24419 Gestational diabetes mellitus in pregnancy, unspecified control: Secondary | ICD-10-CM

## 2020-03-01 NOTE — Progress Notes (Signed)
Patient was seen on 03/01/20 for Gestational Diabetes self-management class at the [facility]. The following learning objectives were met by the patient during this course:   States the definition of Gestational Diabetes  States why dietary management is important in controlling blood glucose  Describes the effects each nutrient has on blood glucose levels  Demonstrates ability to create a balanced meal plan  Demonstrates carbohydrate counting   States when to check blood glucose levels  Demonstrates proper blood glucose monitoring techniques  States the effect of stress and exercise on blood glucose levels  States the importance of limiting caffeine and abstaining from alcohol and smoking  Blood glucose monitor given: none. Patient has meter and is checking prior to class   Patient instructed to monitor glucose levels: FBS: 60 - <95; 1 hour: <140; 2 hour: <120  Patient received handouts:  Nutrition Diabetes and Pregnancy, including carb counting list  Patient will be seen for follow-up as needed.

## 2020-03-06 ENCOUNTER — Encounter: Payer: Self-pay | Admitting: Registered"

## 2020-03-06 DIAGNOSIS — O24419 Gestational diabetes mellitus in pregnancy, unspecified control: Secondary | ICD-10-CM | POA: Insufficient documentation

## 2020-03-15 DIAGNOSIS — Z3403 Encounter for supervision of normal first pregnancy, third trimester: Secondary | ICD-10-CM | POA: Diagnosis not present

## 2020-03-15 DIAGNOSIS — O99810 Abnormal glucose complicating pregnancy: Secondary | ICD-10-CM | POA: Diagnosis not present

## 2020-03-15 DIAGNOSIS — Z3A33 33 weeks gestation of pregnancy: Secondary | ICD-10-CM | POA: Diagnosis not present

## 2020-03-25 NOTE — L&D Delivery Note (Signed)
Delivery Note At 1:16 AM a viable and healthy female was delivered via Vaginal, Spontaneous (Presentation: Left Occiput Anterior, restituted to direct OP).  APGAR: 9, 9; weight pending .   Placenta status: Spontaneous, Intact.  Cord: 3 vessels with the following complications: None.  Cord pH: NA  Anesthesia: Epidural Episiotomy: None Lacerations: Labial;Vaginal Suture Repair: 3.0 vicryl rapide Est. Blood Loss (mL): 250  Mom to postpartum.  Baby to Couplet care / Skin to Skin.  Robley Fries 05/01/2020, 2:02 AM

## 2020-03-29 LAB — OB RESULTS CONSOLE GBS: GBS: NEGATIVE

## 2020-04-28 ENCOUNTER — Other Ambulatory Visit: Payer: Self-pay | Admitting: Obstetrics & Gynecology

## 2020-04-29 ENCOUNTER — Encounter (HOSPITAL_COMMUNITY): Payer: Self-pay | Admitting: *Deleted

## 2020-04-30 ENCOUNTER — Inpatient Hospital Stay (HOSPITAL_COMMUNITY): Payer: No Typology Code available for payment source | Admitting: Anesthesiology

## 2020-04-30 ENCOUNTER — Inpatient Hospital Stay (HOSPITAL_COMMUNITY): Payer: No Typology Code available for payment source

## 2020-04-30 ENCOUNTER — Encounter (HOSPITAL_COMMUNITY): Payer: Self-pay | Admitting: Obstetrics & Gynecology

## 2020-04-30 ENCOUNTER — Inpatient Hospital Stay (HOSPITAL_COMMUNITY)
Admission: AD | Admit: 2020-04-30 | Discharge: 2020-05-03 | DRG: 807 | Disposition: A | Discharge status: Home / Self Care | Payer: No Typology Code available for payment source | Attending: Obstetrics & Gynecology | Admitting: Obstetrics & Gynecology

## 2020-04-30 ENCOUNTER — Other Ambulatory Visit: Payer: Self-pay

## 2020-04-30 DIAGNOSIS — Z20822 Contact with and (suspected) exposure to covid-19: Secondary | ICD-10-CM | POA: Diagnosis present

## 2020-04-30 DIAGNOSIS — O24419 Gestational diabetes mellitus in pregnancy, unspecified control: Secondary | ICD-10-CM | POA: Diagnosis present

## 2020-04-30 DIAGNOSIS — Z3A39 39 weeks gestation of pregnancy: Secondary | ICD-10-CM | POA: Diagnosis not present

## 2020-04-30 DIAGNOSIS — O24420 Gestational diabetes mellitus in childbirth, diet controlled: Principal | ICD-10-CM | POA: Diagnosis present

## 2020-04-30 DIAGNOSIS — O24410 Gestational diabetes mellitus in pregnancy, diet controlled: Secondary | ICD-10-CM | POA: Diagnosis present

## 2020-04-30 DIAGNOSIS — Z3490 Encounter for supervision of normal pregnancy, unspecified, unspecified trimester: Secondary | ICD-10-CM | POA: Diagnosis present

## 2020-04-30 LAB — CBC
HCT: 40.9 % (ref 36.0–46.0)
Hemoglobin: 13.4 g/dL (ref 12.0–15.0)
MCH: 28.4 pg (ref 26.0–34.0)
MCHC: 32.8 g/dL (ref 30.0–36.0)
MCV: 86.7 fL (ref 80.0–100.0)
Platelets: 251 10*3/uL (ref 150–400)
RBC: 4.72 MIL/uL (ref 3.87–5.11)
RDW: 13.3 % (ref 11.5–15.5)
WBC: 8.8 10*3/uL (ref 4.0–10.5)
nRBC: 0 % (ref 0.0–0.2)

## 2020-04-30 LAB — TYPE AND SCREEN
ABO/RH(D): O POS
Antibody Screen: NEGATIVE

## 2020-04-30 LAB — RPR: RPR Ser Ql: NONREACTIVE

## 2020-04-30 LAB — GLUCOSE, CAPILLARY
Glucose-Capillary: 100 mg/dL — ABNORMAL HIGH (ref 70–99)
Glucose-Capillary: 62 mg/dL — ABNORMAL LOW (ref 70–99)
Glucose-Capillary: 84 mg/dL (ref 70–99)
Glucose-Capillary: 70 mg/dL (ref 70–99)
Glucose-Capillary: 68 mg/dL — ABNORMAL LOW (ref 70–99)

## 2020-04-30 LAB — SARS CORONAVIRUS 2 BY RT PCR (HOSPITAL ORDER, PERFORMED IN ~~LOC~~ HOSPITAL LAB): SARS Coronavirus 2: NEGATIVE

## 2020-04-30 MED ORDER — LIDOCAINE HCL (PF) 1 % IJ SOLN: 30.0000 mL | INTRAMUSCULAR | Status: DC | PRN

## 2020-04-30 MED ORDER — LACTATED RINGERS IV SOLN: INTRAVENOUS | Status: DC

## 2020-04-30 MED ORDER — OXYTOCIN-SODIUM CHLORIDE 30-0.9 UT/500ML-% IV SOLN
1.0000 m[IU]/min | INTRAVENOUS | Status: DC
  Administered 2020-04-30: 2 m[IU]/min via INTRAVENOUS
  Filled 2020-04-30: qty 500

## 2020-04-30 MED ORDER — FENTANYL-BUPIVACAINE-NACL 0.5-0.125-0.9 MG/250ML-% EP SOLN
12.0000 mL/h | EPIDURAL | Status: DC | PRN
Start: 2020-04-30 — End: 2020-05-01
  Administered 2020-04-30: 12 mL/h via EPIDURAL
  Filled 2020-04-30: qty 250

## 2020-04-30 MED ORDER — OXYTOCIN BOLUS FROM INFUSION
333.0000 mL | Freq: Once | INTRAVENOUS | Status: AC
  Administered 2020-05-01: 333 mL via INTRAVENOUS

## 2020-04-30 MED ORDER — TRANEXAMIC ACID-NACL 1000-0.7 MG/100ML-% IV SOLN
INTRAVENOUS | Status: AC
  Filled 2020-04-30: qty 100

## 2020-04-30 MED ORDER — PHENYLEPHRINE 40 MCG/ML (10ML) SYRINGE FOR IV PUSH (FOR BLOOD PRESSURE SUPPORT): 80.0000 ug | PREFILLED_SYRINGE | INTRAVENOUS | Status: DC | PRN

## 2020-04-30 MED ORDER — LIDOCAINE-EPINEPHRINE (PF) 2 %-1:200000 IJ SOLN
INTRAMUSCULAR | Status: DC | PRN
  Administered 2020-04-30: 5 mL via EPIDURAL

## 2020-04-30 MED ORDER — OXYTOCIN-SODIUM CHLORIDE 30-0.9 UT/500ML-% IV SOLN
2.5000 [IU]/h | INTRAVENOUS | Status: DC
  Filled 2020-04-30: qty 500

## 2020-04-30 MED ORDER — EPHEDRINE 5 MG/ML INJ: 10.0000 mg | INTRAVENOUS | Status: DC | PRN

## 2020-04-30 MED ORDER — LACTATED RINGERS IV SOLN
500.0000 mL | Freq: Once | INTRAVENOUS | Status: AC
  Administered 2020-04-30: 500 mL via INTRAVENOUS

## 2020-04-30 MED ORDER — ONDANSETRON HCL 4 MG/2ML IJ SOLN: 4.0000 mg | Freq: Four times a day (QID) | INTRAMUSCULAR | Status: DC | PRN

## 2020-04-30 MED ORDER — TERBUTALINE SULFATE 1 MG/ML IJ SOLN: 0.2500 mg | Freq: Once | INTRAMUSCULAR | Status: DC | PRN

## 2020-04-30 MED ORDER — LACTATED RINGERS IV SOLN: 500.0000 mL | INTRAVENOUS | Status: DC | PRN

## 2020-04-30 MED ORDER — SOD CITRATE-CITRIC ACID 500-334 MG/5ML PO SOLN: 30.0000 mL | ORAL | Status: DC | PRN

## 2020-04-30 MED ORDER — ACETAMINOPHEN 325 MG PO TABS
650.0000 mg | ORAL_TABLET | ORAL | Status: DC | PRN
  Administered 2020-04-30: 650 mg via ORAL
  Filled 2020-04-30: qty 2

## 2020-04-30 MED ORDER — DIPHENHYDRAMINE HCL 50 MG/ML IJ SOLN: 12.5000 mg | INTRAMUSCULAR | Status: DC | PRN

## 2020-04-30 NOTE — Anesthesia Procedure Notes (Signed)
Epidural Patient location during procedure: OB Start time: 04/30/2020 6:55 PM End time: 04/30/2020 7:05 PM  Staffing Anesthesiologist: Elmer Picker, MD Performed: anesthesiologist   Preanesthetic Checklist Completed: patient identified, IV checked, risks and benefits discussed, monitors and equipment checked, pre-op evaluation and timeout performed  Epidural Patient position: sitting Prep: DuraPrep and site prepped and draped Patient monitoring: continuous pulse ox, blood pressure, heart rate and cardiac monitor Approach: midline Location: L3-L4 Injection technique: LOR air  Needle:  Needle type: Tuohy  Needle gauge: 17 G Needle length: 9 cm Needle insertion depth: 6 cm Catheter type: closed end flexible Catheter size: 19 Gauge Catheter at skin depth: 12 cm Test dose: negative  Assessment Sensory level: T8 Events: blood not aspirated, injection not painful, no injection resistance, no paresthesia and negative IV test  Additional Notes Patient identified. Risks/Benefits/Options discussed with patient including but not limited to bleeding, infection, nerve damage, paralysis, failed block, incomplete pain control, headache, blood pressure changes, nausea, vomiting, reactions to medication both or allergic, itching and postpartum back pain. Confirmed with bedside nurse the patient's most recent platelet count. Confirmed with patient that they are not currently taking any anticoagulation, have any bleeding history or any family history of bleeding disorders. Patient expressed understanding and wished to proceed. All questions were answered. Sterile technique was used throughout the entire procedure. Please see nursing notes for vital signs. Test dose was given through epidural catheter and negative prior to continuing to dose epidural or start infusion. Warning signs of high block given to the patient including shortness of breath, tingling/numbness in hands, complete motor block, or  any concerning symptoms with instructions to call for help. Patient was given instructions on fall risk and not to get out of bed. All questions and concerns addressed with instructions to call with any issues or inadequate analgesia.  Reason for block:procedure for pain

## 2020-04-30 NOTE — Progress Notes (Addendum)
IOL for GDM  39.4 wks S/p Cervical foley and pitocin at admission. Foley expelled and AROM at 3.45 p, with -3 station. Now s/p epidural and resting.   BP 115/60   Pulse 76   Temp 98.2 F (36.8 C) (Oral)   Resp 16   Ht 5\' 5"  (1.651 m)   Wt 86.1 kg   BMI 31.60 kg/m  FHT 140s + accels no decels mod variab- cat I  Toco q 2-3 min SVE 6/50%/-3 Vx. Head not well applied to cervix.   CBG reviewed, some lows, drinking juice as needed Pelvis borderline. Assess rotation and descent, trying lateral positions with Exaggerated sims, plan to use Peanut ball if further descent noted.   MD

## 2020-04-30 NOTE — Progress Notes (Signed)
CBG resulted 68.  Treatment: cranberry juice No symptoms

## 2020-04-30 NOTE — H&P (Signed)
Kara Simpson is a 30 y.o. female presenting for IOL at 39.4 wks for A1GDM, suspected LGA.  PNcare from 8 wks. Healthy female, 27 lb wt gain, GDM-A1, at 30 wks, well controlled. Growth - 33 wks - 4'14" 56%, AC 85%, BPD91%, AFI nl, Vx.  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 Contraceptive management 11/19/2012   Past Surgical History:  Procedure Laterality Date  . WISDOM TOOTH EXTRACTION     Family History: family history includes Heart attack in her father. Social History:  reports that she has never smoked. She has never used smokeless tobacco. She reports current alcohol use. She reports that she does not use drugs.     Maternal Diabetes: Yes:  Diabetes Type:  Diet controlled Genetic Screening: Normal NIPS, XX Maternal Ultrasounds/Referrals: Normal Fetal Ultrasounds or other Referrals:  None Maternal Substance Abuse:  No Significant Maternal Medications:  None Significant Maternal Lab Results:  Group B Strep negative Other Comments:  None  Review of Systems History Dilation: 2 Effacement (%): 50 Station: -2 Exam by:: Catie Futures trader Blood pressure 118/63, pulse 71, temperature 98.4 F (36.9 C), temperature source Oral, resp. rate 14, height 5\' 5"  (1.651 m), weight 86.1 kg. Exam Physical Exam  A&O x 3, no acute distress. Pleasant HEENT neg, no thyromegaly Lungs CTA bilat CV RRR, S1S2 normal Abdo soft, non tender, non acute Extr no edema/ tenderness Pelvic above FHT 1450-150/ mod variability, + accels, no decels - cat I Toco irreg   Prenatal labs: ABO, Rh: --/--/O POS (02/06 12-14-1992) Antibody: NEG (02/06 0748) Rubella: Immune (07/15 0000) RPR: Nonreactive (07/15 0000)  HBsAg: Negative (07/15 0000)  HIV: Non-reactive (07/15 0000)   GBS: Negative/-- (01/05 0000)  NIPS- [employer]- low risk, XX AFP1 nl GTT abn   Assessment/Plan: 30 yo G1 at 39.4 wks, here for IOL for GDM A1. EFW 7.1/2- 8 lbs. FHT cat I GBS(-) Watch high station (BPD 91% at 33 wks) CBG q 4 hrs  MOD- working towards vaginal delivery  26 04/30/2020, 9:42 AM

## 2020-04-30 NOTE — Anesthesia Preprocedure Evaluation (Signed)
Anesthesia Evaluation  Patient identified by MRN, date of birth, ID band Patient awake    Reviewed: Allergy & Precautions, NPO status , Patient's Chart, lab work & pertinent test results  Airway Mallampati: II  TM Distance: >3 FB Neck ROM: Full    Dental no notable dental hx.    Pulmonary neg pulmonary ROS,    Pulmonary exam normal breath sounds clear to auscultation       Cardiovascular negative cardio ROS Normal cardiovascular exam Rhythm:Regular Rate:Normal     Neuro/Psych negative neurological ROS  negative psych ROS   GI/Hepatic negative GI ROS, Neg liver ROS,   Endo/Other  diabetes, Gestational  Renal/GU negative Renal ROS  negative genitourinary   Musculoskeletal negative musculoskeletal ROS (+)   Abdominal   Peds  Hematology negative hematology ROS (+)   Anesthesia Other Findings IOL for gDM  Reproductive/Obstetrics (+) Pregnancy                             Anesthesia Physical Anesthesia Plan  ASA: III  Anesthesia Plan: Epidural   Post-op Pain Management:    Induction:   PONV Risk Score and Plan: Treatment may vary due to age or medical condition  Airway Management Planned: Natural Airway  Additional Equipment:   Intra-op Plan:   Post-operative Plan:   Informed Consent: I have reviewed the patients History and Physical, chart, labs and discussed the procedure including the risks, benefits and alternatives for the proposed anesthesia with the patient or authorized representative who has indicated his/her understanding and acceptance.       Plan Discussed with: Anesthesiologist  Anesthesia Plan Comments: (Patient identified. Risks, benefits, options discussed with patient including but not limited to bleeding, infection, nerve damage, paralysis, failed block, incomplete pain control, headache, blood pressure changes, nausea, vomiting, reactions to medication,  itching, and post partum back pain. Confirmed with bedside nurse the patient's most recent platelet count. Confirmed with the patient that they are not taking any anticoagulation, have any bleeding history or any family history of bleeding disorders. Patient expressed understanding and wishes to proceed. All questions were answered. )        Anesthesia Quick Evaluation  

## 2020-04-30 NOTE — Progress Notes (Signed)
Here for IOL, see H&P  Comfortable, starting to feel contractions   Patient Vitals for the past 24 hrs:  BP Temp Temp src Pulse Resp Height Weight  04/30/20 1030 125/77 -- -- 77 16 -- --  04/30/20 0930 118/63 -- -- 71 14 -- --  04/30/20 0900 118/72 -- -- 76 16 -- --  04/30/20 0811 126/74 -- -- 85 14 -- --  04/30/20 0731 133/73 98.4 F (36.9 C) Oral 88 16 5\' 5"  (1.651 m) 86.1 kg   A&ox3 nml respirations Soft, nt, gravid CX: 2/40/-3 LE: tr edema, nt bilat   FHT: 130s, nml variability, +accels, no decels Toco: q 1-3 min  A/P: iupa t 39.4 wga 1. IOL - contin pitocin, tol balloon placement well, plan svd 2. Fetal status reassuring 3. gbs neg 4. gdma1

## 2020-04-30 NOTE — Progress Notes (Signed)
Hypoglycemic Event  CBG: 62  Treatment: 4 oz juice/soda  Symptoms: Sweaty and Shaky  Follow-up CBG: Time:1216 CBG Result:84  Possible Reasons for Event: Inadequate meal intake  Comments/MD notified: Dr. Juliene Pina aware    Craig Staggers

## 2020-04-30 NOTE — Progress Notes (Signed)
IOL for GDM BP 118/74   Pulse 70   Temp 98.2 F (36.8 C) (Oral)   Resp 14   Ht 5\' 5"  (1.651 m)   Wt 86.1 kg   BMI 31.60 kg/m  FHT 150s + accels no decels mod variab- cat I  Toco irreg   IOL with pitocin, cervical foley expelled, AROM at 3.15 pm, clear fluid, head entering inlet now. Assess pelvis / descent   A1GDM- low CBG correct by po intake, feels well now   -V.Shaune Malacara MD

## 2020-05-01 ENCOUNTER — Encounter (HOSPITAL_COMMUNITY): Payer: Self-pay | Admitting: Obstetrics & Gynecology

## 2020-05-01 DIAGNOSIS — O24410 Gestational diabetes mellitus in pregnancy, diet controlled: Secondary | ICD-10-CM | POA: Diagnosis present

## 2020-05-01 LAB — CBC
HCT: 37.1 % (ref 36.0–46.0)
Hemoglobin: 12.9 g/dL (ref 12.0–15.0)
MCH: 29.8 pg (ref 26.0–34.0)
MCHC: 34.8 g/dL (ref 30.0–36.0)
MCV: 85.7 fL (ref 80.0–100.0)
Platelets: 219 10*3/uL (ref 150–400)
RBC: 4.33 MIL/uL (ref 3.87–5.11)
RDW: 13.3 % (ref 11.5–15.5)
WBC: 17.8 10*3/uL — ABNORMAL HIGH (ref 4.0–10.5)
nRBC: 0 % (ref 0.0–0.2)

## 2020-05-01 MED ORDER — ACETAMINOPHEN 325 MG PO TABS: 650.0000 mg | ORAL_TABLET | ORAL | Status: DC | PRN

## 2020-05-01 MED ORDER — ONDANSETRON HCL 4 MG PO TABS: 4.0000 mg | ORAL_TABLET | ORAL | Status: DC | PRN

## 2020-05-01 MED ORDER — ONDANSETRON HCL 4 MG/2ML IJ SOLN: 4.0000 mg | INTRAMUSCULAR | Status: DC | PRN

## 2020-05-01 MED ORDER — SIMETHICONE 80 MG PO CHEW: 80.0000 mg | CHEWABLE_TABLET | ORAL | Status: DC | PRN

## 2020-05-01 MED ORDER — SENNOSIDES-DOCUSATE SODIUM 8.6-50 MG PO TABS
2.0000 | ORAL_TABLET | Freq: Every day | ORAL | Status: DC
  Filled 2020-05-01: qty 2

## 2020-05-01 MED ORDER — COCONUT OIL OIL
1.0000 "application " | TOPICAL_OIL | Status: DC | PRN
  Administered 2020-05-01: 1 via TOPICAL

## 2020-05-01 MED ORDER — TETANUS-DIPHTH-ACELL PERTUSSIS 5-2.5-18.5 LF-MCG/0.5 IM SUSY: 0.5000 mL | PREFILLED_SYRINGE | Freq: Once | INTRAMUSCULAR | Status: DC

## 2020-05-01 MED ORDER — BENZOCAINE-MENTHOL 20-0.5 % EX AERO
1.0000 "application " | INHALATION_SPRAY | CUTANEOUS | Status: DC | PRN
  Administered 2020-05-01: 1 via TOPICAL
  Filled 2020-05-01: qty 56

## 2020-05-01 MED ORDER — ZOLPIDEM TARTRATE 5 MG PO TABS: 5.0000 mg | ORAL_TABLET | Freq: Every evening | ORAL | Status: DC | PRN

## 2020-05-01 MED ORDER — DIBUCAINE (PERIANAL) 1 % EX OINT: 1.0000 "application " | TOPICAL_OINTMENT | CUTANEOUS | Status: DC | PRN

## 2020-05-01 MED ORDER — PRENATAL MULTIVITAMIN CH
1.0000 | ORAL_TABLET | Freq: Every day | ORAL | Status: DC
  Filled 2020-05-01: qty 1

## 2020-05-01 MED ORDER — IBUPROFEN 600 MG PO TABS
600.0000 mg | ORAL_TABLET | Freq: Four times a day (QID) | ORAL | Status: DC
  Administered 2020-05-01 – 2020-05-03 (×6): 600 mg via ORAL
  Filled 2020-05-01 (×8): qty 1

## 2020-05-01 MED ORDER — DIPHENHYDRAMINE HCL 25 MG PO CAPS: 25.0000 mg | ORAL_CAPSULE | Freq: Four times a day (QID) | ORAL | Status: DC | PRN

## 2020-05-01 MED ORDER — WITCH HAZEL-GLYCERIN EX PADS: 1.0000 "application " | MEDICATED_PAD | CUTANEOUS | Status: DC | PRN

## 2020-05-01 NOTE — Lactation Note (Signed)
This note was copied from a baby's chart. Lactation Consultation Note Baby 6 hrs old. Unable to maintain latch. Baby suckled a few minute while LC t--cup areola and nipple into mouth and hold it the entire time. Baby cont. To tongue thrust. Mom needs #20 NS. Asked mom if MGM or someone bring a NS. Mom stated yes. Attempted #24 NS to large.  Mom shown how to use DEBP & how to disassemble, clean, & reassemble parts. Mom knows to pump q3h for 15-20 min. Suggested mom pre-pump before latching to assist in everting nipple for latching. Mom in agreement.  Mom encouraged to do skin-to-skin. Mom encouraged to feed baby 8-12 times/24 hours and with feeding cues.  Newborn feeding habits, behavior, importance of I&O.  Will f/u today when mom gets NS. Lactation brochure given. Suggest mom make OP LC appt  Patient Name: Kara Simpson [id number] Date: 05/01/2020   Age:63 hours  Maternal Data    Feeding    LATCH Score                    Lactation Tools Discussed/Used    Interventions    Discharge    Consult Status      Charyl Dancer 05/01/2020, 7:25 AM

## 2020-05-01 NOTE — Lactation Note (Signed)
This note was copied from a baby's chart. Lactation Consultation Note See previous note. Patient Name: Kara Simpson [id number] Date: 05/01/2020 Reason for consult: Primapara;Term Age:28 hours  Maternal Data Has patient been taught Hand Expression?: Yes Does the patient have breastfeeding experience prior to this delivery?: No  Feeding    LATCH Score Latch: Repeated attempts needed to sustain latch, nipple held in mouth throughout feeding, stimulation needed to elicit sucking reflex.  Audible Swallowing: None  Type of Nipple: Flat  Comfort (Breast/Nipple): Soft / non-tender  Hold (Positioning): Full assist, staff holds infant at breast  LATCH Score: 4   Lactation Tools Discussed/Used Tools: Shells;Pump Breast pump type: Double-Electric Breast Pump Pump Education: Setup, frequency, and cleaning;Milk Storage Reason for Pumping: flat Pumping frequency: Q3  Interventions Interventions: Breast feeding basics reviewed;Adjust position;DEBP;Assisted with latch;Support pillows;Skin to skin;Position options;Breast massage;Expressed milk;Hand express;Pre-pump if needed;Shells;Breast compression;Hand pump  Discharge Pump: DEBP  Consult Status Consult Status: Follow-up Date: 05/01/20 Follow-up type: In-patient    Charyl Dancer 05/01/2020, 7:32 AM

## 2020-05-01 NOTE — Lactation Note (Addendum)
This note was copied from a baby's chart. Lactation Consultation Note  Patient Name: Kara Simpson [id number] Date: 05/01/2020 Reason for consult: Follow-up assessment;Term;Primapara;1st time breastfeeding Age:28 hours   P1 mother whose infant is now 78 hours old.  This is a term baby at 39+5 weeks.    Baby awakened after NP assessment.  Offered to assist with latching and mother agreeable.  Asked mother to demonstrate hand expression and she was able to obtain a few drops of colostrum which I finger fed back to baby.  Baby has a tight suck so provided suck training with colostrum drops.  Assisted to latch in the football hold to the left breast after a couple attempts.  Demonstrated breast compressions and gentle stimulation to keep baby awake at the breast.  Baby sucked on/off for 6 minutes before pushing back and becoming uninterested.  Mother had 6 mls of EBM at bedside.  Swaddled baby and held her while mother spoon fed this EBM. Burped well after feeding.  Reviewed pump set up with mother and she will begin pumping after every feeding for breast stimulation and to provide supplementation.  Mother has been wearing breast shells with some nipple eversion and I encouraged the manual pump right before latching to also aid with nipple eversion.  Family member is bringing in a NS per previous LC request.  Asked mother to call for assistance with the NS: not certain she will need it at this time, however, this will need to be assessed.    Father present and holding baby while mother pumps.      Mother is a Producer, television/film/video and pump choices provided.  She will call me when she has made a pump decision.  RN updated.    Maternal Data    Feeding Mother's Current Feeding Choice: Breast Milk  LATCH Score Latch: Repeated attempts needed to sustain latch, nipple held in mouth throughout feeding, stimulation needed to elicit sucking reflex.  Audible Swallowing: None  Type of Nipple: Everted  at rest and after stimulation  Comfort (Breast/Nipple): Soft / non-tender  Hold (Positioning): Assistance needed to correctly position infant at breast and maintain latch.  LATCH Score: 6   Lactation Tools Discussed/Used Tools: Shells;Pump;Flanges Breast pump type: Double-Electric Breast Pump;Manual Pump Education: Setup, frequency, and cleaning;Milk Storage (reviewed) Reason for Pumping: Supplementation  Interventions Interventions: Breast feeding basics reviewed;Assisted with latch;Skin to skin;Breast massage;Hand express;Pre-pump if needed;Breast compression;Adjust position;DEBP;Hand pump;Shells;Expressed milk;Position options;Support pillows;Education  Discharge Pump: DEBP;Manual;Personal  Consult Status Consult Status: Follow-up Date: 05/02/20 Follow-up type: In-patient    Dora Sims 05/01/2020, 12:37 PM

## 2020-05-01 NOTE — Lactation Note (Signed)
This note was copied from a baby's chart. Lactation Consultation Note Baby 1 hr old. Baby cueing to feed. RN doing assessment and then wt. LC latched baby STS. Baby unable to latch. LC compressed breast holding breast tissue in baby's mouth. Baby started suckling on top lip and tongue thrusting nipple out. LC unable to obtain deep latch. Breast heavy. LC done hand expression and colostrum poured from breast. LC collected colostrum in spoon and spoon fed baby 1. To help soften breast tissue for latching, 2. Mom is GDM and baby needed to eat.  Mom will need NS, shells and DEBP in room. LC will set up pump when she arrives. F/U on MBU. Patient Name: Kara Simpson [id number] Date: 05/01/2020 Reason for consult: L&D Initial assessment Age:76 hours  Maternal Data Has patient been taught Hand Expression?: Yes Does the patient have breastfeeding experience prior to this delivery?: No  Feeding    LATCH Score Latch: Repeated attempts needed to sustain latch, nipple held in mouth throughout feeding, stimulation needed to elicit sucking reflex.  Audible Swallowing: None  Type of Nipple: Flat  Comfort (Breast/Nipple): Soft / non-tender  Hold (Positioning): Full assist, staff holds infant at breast  LATCH Score: 4   Lactation Tools Discussed/Used    Interventions Interventions: Breast feeding basics reviewed;Adjust position;Assisted with latch;Support pillows;Skin to skin;Position options;Breast massage;Expressed milk;Hand express;Pre-pump if needed;Breast compression  Discharge WIC Program: No  Consult Status Consult Status: Follow-up Date: 05/01/20 Follow-up type: In-patient    Kara Simpson, [name] 05/01/2020, 3:09 AM

## 2020-05-01 NOTE — Anesthesia Postprocedure Evaluation (Signed)
Anesthesia Post Note  Patient: Kara Simpson  Procedure(s) Performed: AN AD HOC LABOR EPIDURAL     Patient location during evaluation: Mother Baby Anesthesia Type: Epidural Level of consciousness: awake and alert and oriented Pain management: satisfactory to patient Vital Signs Assessment: post-procedure vital signs reviewed and stable Respiratory status: respiratory function stable Cardiovascular status: stable Postop Assessment: no headache, no backache, epidural receding, patient able to bend at knees, no signs of nausea or vomiting, adequate PO intake and able to ambulate Anesthetic complications: no   No complications documented.  Last Vitals:  Vitals:   05/01/20 0429 05/01/20 0800  BP: 119/71 119/70  Pulse: 69 72  Resp: 18 20  Temp: 36.7 C 36.7 C    Last Pain:  Vitals:   05/01/20 0800  TempSrc: Oral  PainSc: 2    Pain Goal: Patients Stated Pain Goal: 1 (04/30/20 1925)              Epidural/Spinal Function Cutaneous sensation: Normal sensation (05/01/20 0800), Patient able to flex knees: Yes (05/01/20 0800), Patient able to lift hips off bed: Yes (05/01/20 0800), Back pain beyond tenderness at insertion site: No (05/01/20 0800), Progressively worsening motor and/or sensory loss: No (05/01/20 0800)  Shaunna Rosetti

## 2020-05-01 NOTE — Lactation Note (Signed)
This note was copied from a baby's chart. Lactation Consultation Note  Patient Name: Kara Simpson [id number] Date: 05/01/2020 Reason for consult: Follow-up assessment Age:28 hours   LC Follow Up Visit:  Mother trying to rest at this time.  Provided list of employee pumps and mother will call for assistance when baby awakens.     Maternal Data    Feeding    LATCH Score                    Lactation Tools Discussed/Used    Interventions    Discharge    Consult Status Consult Status: Follow-up Date: 05/01/20 Follow-up type: In-patient    Dora Sims 05/01/2020, 9:37 AM

## 2020-05-01 NOTE — Progress Notes (Signed)
IOL for GDM  39.4 wks S/p Cervical foley and pitocin at admission. Foley expelled and AROM at 3.45 p, with -3 station. Now s/p epidural and resting.   BP 125/83   Pulse 76   Temp 98.2 F (36.8 C) (Oral)   Resp 16   Ht 5\' 5"  (1.651 m)   Wt 86.1 kg   BMI 31.60 kg/m  FHT 140s + accels no decels mod variab- cat I  Toco q 2-3 min SVE complete OP +1, desiring to push   Pelvis borderline. Stage II, pushing started. MOD guarded   -V.Caeli Linehan MD

## 2020-05-02 NOTE — Lactation Note (Signed)
This note was copied from a baby's chart. Lactation Consultation Note  Patient Name: Kara Simpson MVHQI'O Date: 05/02/2020 Reason for consult: Follow-up assessment;Primapara;1st time breastfeeding;Term;Difficult latch Age:28 hours  Called to mom's room to assist with latch. Upon entering room [place], RN obtaining baby's VS, mom sitting in chair holding baby, dad sitting on couch. Mom reports last feeding ~1:15p last for , baby took 69ml formula following feeding.  Mom removed breast shell and latched baby to right breast with assistance. Audible swallows noted with stimulation, baby required stimulation to keep alert at breast, baby fed ~ . 28mm nipple shield applied per mom's request, feeding appeared to improve slightly yielding 1-2 more frequent swallows, mom noted more active tugs at breast and uterine cramping. Advised for this feeding burp and offer left breast with nipple shield, supplement. Encouraged continued use of DEBP for stimulation, hand express for colostrum and offer back to baby. Mom voiced understanding and with no further concerns. Left the room with baby still latched ~61min mark. BGilliam, RN, IBCLC  Plan  - feed on cue, wake if >3hrs since last feeding - latch with nipple shield (apply EBM to tip) - DEBP after feedings and hand expression - skin to skin - call for LC support if needed before next visit   Feeding Mother's Current Feeding Choice: Breast Milk and Formula Nipple Type: Slow - flow  LATCH Score Latch: Grasps breast easily, tongue down, lips flanged, rhythmical sucking.  Audible Swallowing: A few with stimulation  Type of Nipple: Flat  Comfort (Breast/Nipple): Soft / non-tender  Hold (Positioning): Assistance needed to correctly position infant at breast and maintain latch.  LATCH Score: 7   Lactation Tools Discussed/Used Tools: Nipple Shields Nipple shield size: 24  Interventions Interventions: Assisted with latch;Skin to  skin;Support pillows  Discharge    Consult Status Consult Status: Follow-up Date: 05/03/20 Follow-up type: In-patient    Kara Simpson 05/02/2020, 5:23 PM

## 2020-05-02 NOTE — Progress Notes (Signed)
PPD # 1 S/P NSVD  Live born female  Birth Weight: 7 lb 1.2 oz (3209 g) APGAR: 9, 9  Newborn Delivery   Birth date/time: 05/01/2020 01:16:00 Delivery type: Vaginal, Spontaneous     Baby name: Francena Hanly Delivering provider: MODY, VAISHALI  Episiotomy:None   Lacerations:Labial;Vaginal   Feeding: breast and bottle  Pain control at delivery: Epidural   S:  Reports feeling tired but well.             Tolerating po/ No nausea or vomiting             Bleeding is moderate             Pain controlled with acetaminophen and ibuprofen (OTC)             Up ad lib / ambulatory / voiding without difficulties   O:  A & O x 3, in no apparent distress              VS:  Vitals:   05/01/20 1254 05/01/20 1543 05/01/20 1930 05/02/20 0534  BP: 104/67 118/70 113/68 108/69  Pulse: 62 74 60 (!) 55  Resp: 18 20 18    Temp:  98.3 F (36.8 C) 97.9 F (36.6 C) 98 F (36.7 C)  TempSrc: Oral Oral Oral Oral  SpO2:    99%  Weight:      Height:        LABS:  Recent Labs    04/30/20 0740 05/01/20 0452  WBC 8.8 17.8*  HGB 13.4 12.9  HCT 40.9 37.1  PLT 251 219    Blood type: --/--/O POS (02/06 [birth year]-1992)  Rubella: Immune (07/15 0000)   I&O: I/O last 3 completed shifts: In: -  Out: 1110 [Urine:860; Blood:250]          No intake/output data recorded.  Vaccines: TDaP          UTD         Flu             ?                    COVID-19 planned outpatient  Gen: AAO x 3, NAD  Abdomen: soft, non-tender, non-distended             Fundus: firm, non-tender, U-1  Perineum: intact, no edema  Lochia: small  Extremities: no edema, no calf pain or tenderness    A/P: PPD # 1 30 y.o., G1P1001   Principal Problem:   Postpartum care following vaginal delivery 2/7 Active Problems:   Encounter for induction of labor   SVD (spontaneous vaginal delivery)   Obstetric labial and vaginal lacerations   GDM, class A1  - F/U 2GTT 6-8 wks PP  Doing well - stable status  Routine post partum orders  Anticipate  discharge tomorrow    4/7, MSN, CNM 05/02/2020, 11:49 AM

## 2020-05-02 NOTE — Lactation Note (Signed)
This note was copied from a baby's chart. Lactation Consultation Note  Patient Name: Kara Simpson HDQQI'W Date: 05/02/2020 Reason for consult: Follow-up assessment;1st time breastfeeding;Primapara;Term Age:28 hours  Upon entering the room mom sitting in chair, dad sitting on couch formula feeding baby. Mom states baby fed at the breast ~47mins was still fussy and with feeding cues, therefore supplemented with 5ml formula. Reports obtained ~43ml colostrum from DEBP. Mom concerned baby is not getting enough from the breast and would like to know if should continue with supplementation. Mom's goal is to exclusively feed at the breast. Mom agreed to call LC to assess the next feeding. Reinforced DEBP for stimulation, optimal skin to skin, feed on cue, wake if >3hrs since next feeding, hand express after feedings/ pumping and offer colostrum back to baby, ok to continue with supplementation at this time d/t percentage weight loss and maternal reports of difficulty feeding, will re-evaluate need on assessment at next visit. [employer]ela Freestyle Flex (employee pump) given. Left the room with dad holding sleeping baby, parents with no further concerns. BGilliam, RN, IBCLC   Feeding Mother's Current Feeding Choice: Breast Milk and Formula   Interventions Interventions: Breast feeding basics reviewed  Discharge    Consult Status Consult Status: Follow-up Date: 05/02/20 Follow-up type: In-patient    [name] 05/02/2020, 2:06 PM

## 2020-05-03 ENCOUNTER — Other Ambulatory Visit (HOSPITAL_COMMUNITY): Payer: Self-pay

## 2020-05-03 ENCOUNTER — Encounter (HOSPITAL_COMMUNITY): Payer: Self-pay | Admitting: Obstetrics & Gynecology

## 2020-05-03 MED ORDER — IBUPROFEN 600 MG PO TABS: 600.0000 mg | ORAL_TABLET | Freq: Four times a day (QID) | ORAL | 0 refills | Status: DC

## 2020-05-03 MED ORDER — COCONUT OIL OIL: 1.0000 "application " | TOPICAL_OIL | 0 refills | Status: AC | PRN

## 2020-05-03 MED ORDER — BENZOCAINE-MENTHOL 20-0.5 % EX AERO: 1.0000 "application " | INHALATION_SPRAY | CUTANEOUS | Status: AC | PRN

## 2020-05-03 MED ORDER — ACETAMINOPHEN 500 MG PO TABS: 1000.0000 mg | ORAL_TABLET | Freq: Four times a day (QID) | ORAL | 2 refills | Status: DC | PRN

## 2020-05-03 NOTE — Lactation Note (Addendum)
This note was copied from a baby's chart. Lactation Consultation Note  Patient Name: Girl Mashell Sieben XNATF'T Date: 05/03/2020 Reason for consult: Follow-up assessment Age:28 hours  P1 mother whose infant is now 66 hours old.  This is a term baby at 39+5 weeks.  Mother was breast feeding when I arrived.  She was using a NS and baby appeared to be latched correctly.  Mother denied pain and felt uterine contractions with feeding.  Mother feeling much better about feeding now that she has a NS.  Mother has been pumping after feedings and is giving her EBM back to baby.  She recently pumped 9 mls.  Reviewed breast feeding basics.  Baby pulled off the breast and EBM in NS.   Engorgement prevention/treatment reviewed.  Mother has a manual pump and a DEBP for home use.  Father present.  Rn in room at the end of my visit.  Mother has our OP phone number for any concerns after discharge.   Maternal Data    Feeding    LATCH Score Latch: Grasps breast easily, tongue down, lips flanged, rhythmical sucking.  Audible Swallowing: Spontaneous and intermittent  Type of Nipple: Everted at rest and after stimulation  Comfort (Breast/Nipple): Soft / non-tender  Hold (Positioning): Assistance needed to correctly position infant at breast and maintain latch.  LATCH Score: 9   Lactation Tools Discussed/Used    Interventions    Discharge Discharge Education: Engorgement and breast care Pump: DEBP;Manual;Personal  Consult Status Consult Status: Complete Date: 05/03/20 Follow-up type: Call as needed    Sayana Salley R Shyvonne Chastang 05/03/2020, 8:03 AM

## 2020-05-03 NOTE — Discharge Instructions (Signed)
Lactation outpatient support - home visit  Linda Coppola RN, MHA, IBCLC at [facility]: Lactation Consultant  [email or web address] Mail: [email or web address] Tel: [phone]    Additional resources:  [facility] [email or web address]   Chiropractic specialist   Dr. Leanna Hastings [email or web address]  Craniosacral therapy for baby  Erin Balkind  [email or web address]  

## 2020-05-03 NOTE — Discharge Summary (Signed)
OB Discharge Summary  Patient Name: Kara Simpson DOB: 01-29-1991 MRN: [id number]  Date of admission: 04/30/2020 Delivering provider: MODY, VAISHALI   Admitting diagnosis: Encounter for induction of labor [Z34.90] SVD (spontaneous vaginal delivery) [O80] Intrauterine pregnancy: [redacted]w[redacted]d     Secondary diagnosis: Patient Active Problem List   Diagnosis Date Noted  . SVD (spontaneous vaginal delivery) 05/01/2020  . Obstetric labial and vaginal lacerations 05/01/2020  . GDM, class A1 05/01/2020  . Postpartum care following vaginal delivery 2/7 05/01/2020  . Encounter for induction of labor 04/30/2020   Additional problems:none   Date of discharge: 05/03/2020   Discharge diagnosis: Principal Problem:   Postpartum care following vaginal delivery 2/7 Active Problems:   Encounter for induction of labor   SVD (spontaneous vaginal delivery)   Obstetric labial and vaginal lacerations   GDM, class A1                                                              Post partum procedures:none  Augmentation: AROM, Pitocin and IP Foley Pain control: Epidural  Laceration:Labial;Vaginal  Episiotomy:None  Complications: None  Hospital course:  Induction of Labor With Vaginal Delivery   30 y.o. yo G1P1001 at [redacted]w[redacted]d was admitted to the hospital 04/30/2020 for induction of labor.  Indication for induction: A1 DM.  Patient had an uncomplicated labor course as follows: Membrane Rupture Time/Date: 3:46 PM ,04/30/2020   Delivery Method:Vaginal, Spontaneous  Episiotomy: None  Lacerations:  Labial;Vaginal  Details of delivery can be found in separate delivery note.  Patient had a routine postpartum course. Patient is discharged home 05/03/20.  Newborn Data: Birth date:05/01/2020  Birth time:1:16 AM  Gender:Female  Living status:Living  Apgars:9 ,9  Weight:3209 g   Physical exam  Vitals:   05/02/20 0534 05/02/20 1400 05/02/20 2216 05/03/20 0523  BP: 108/69 114/80 126/80 124/80  Pulse: (!) 55 (!) 50  61 63  Resp:  18 18 16   Temp: 98 F (36.7 C) (!) 97.4 F (36.3 C) 98 F (36.7 C) 98.6 F (37 C)  TempSrc: Oral Oral Oral Oral  SpO2: 99%  100% 100%  Weight:      Height:       General: alert, cooperative and no distress Lochia: appropriate Uterine Fundus: firm Incision: N/A Perineum: repair intact, no edema DVT Evaluation: No cords or calf tenderness. No significant calf/ankle edema. Labs: Lab Results  Component Value Date   WBC 17.8 (H) 05/01/2020   HGB 12.9 05/01/2020   HCT 37.1 05/01/2020   MCV 85.7 05/01/2020   PLT 219 05/01/2020   CMP Latest Ref Rng & Units 11/03/2017  Glucose 70 - 99 mg/dL 01/03/2018)  BUN 6 - 20 mg/dL 5(L)  Creatinine 962(X - 1.00 mg/dL 5.28  Sodium 4.13 - 244 mmol/L 140  Potassium 3.5 - 5.1 mmol/L 3.6  Chloride 98 - 111 mmol/L 108  CO2 22 - 32 mmol/L 22  Calcium 8.9 - 10.3 mg/dL 010)  Total Protein 6.5 - 8.1 g/dL 7.1  Total Bilirubin 0.3 - 1.2 mg/dL 0.4  Alkaline Phos 38 - 126 U/L 63  AST 15 - 41 U/L 14(L)  ALT 0 - 44 U/L 14   Edinburgh Postnatal Depression Scale Screening Tool 05/03/2020 05/02/2020 05/01/2020  I have been able to laugh and see the funny side of things.  0 (No Data) (No Data)  I have looked forward with enjoyment to things. 0 - -  I have blamed myself unnecessarily when things went wrong. 1 - -  I have been anxious or worried for no good reason. 0 - -  I have felt scared or panicky for no good reason. 1 - -  Things have been getting on top of me. 1 - -  I have been so unhappy that I have had difficulty sleeping. 0 - -  I have felt sad or miserable. 0 - -  I have been so unhappy that I have been crying. 0 - -  The thought of harming myself has occurred to me. 0 - -  Edinburgh Postnatal Depression Scale Total 3 - -   Vaccines: TDaP          UTD         Flu             UTD                    COVID-19 declined  Discharge instruction:  per After Visit Summary,  [facility] booklet and  "[facility] Baby [facility]" hospital  booklet  After Visit Meds:  Allergies as of 05/03/2020      Reactions   Nickel Hives      Medication List    TAKE these medications   acetaminophen 500 MG tablet Commonly known as: TYLENOL Take 2 tablets (1,000 mg total) by mouth every 6 (six) hours as needed.   benzocaine-Menthol 20-0.5 % Aero Commonly known as: DERMOPLAST Apply 1 application topically as needed for irritation (perineal discomfort).   calcium carbonate 500 MG chewable tablet Commonly known as: TUMS - dosed in mg elemental calcium Chew 1 tablet by mouth daily as needed for indigestion or heartburn.   coconut oil Oil Apply 1 application topically as needed.   ibuprofen 600 MG tablet Commonly known as: ADVIL Take 1 tablet (600 mg total) by mouth every 6 (six) hours.   prenatal multivitamin Tabs tablet Take 1 tablet by mouth daily at 12 noon.            Discharge Care Instructions  (From admission, onward)         Start     Ordered   05/03/20 0000  Discharge wound care:       Comments: Sitz baths 2 times /day with warm water x 1 week. May add herbals: 1 ounce dried comfrey leaf* 1 ounce calendula flowers 1 ounce lavender flowers  Supplies can be found online at [employer] sources at [employer], [employer]  1/2 ounce dried uva ursi leaves 1/2 ounce witch hazel blossoms (if you can find them) 1/2 ounce dried sage leaf 1/2 cup sea salt Directions: Bring 2 quarts of water to a boil. Turn off heat, and place 1 ounce (approximately 1 large handful) of the above mixed herbs (not the salt) into the pot. Steep, covered, for 30 minutes.  Strain the liquid well with a fine mesh strainer, and discard the herb material. Add 2 quarts of liquid to the tub, along with the 1/2 cup of salt. This medicinal liquid can also be made into compresses and peri-rinses.   05/03/20 0929          Diet: routine diet  Activity: Advance as tolerated. Pelvic rest for 6 weeks.   Postpartum contraception:  Not Discussed  Newborn Data: Live born female  Birth Weight:  7 lb 1.2 oz (3209 g) APGAR: 9, 9  Newborn Delivery   Birth date/time: 05/01/2018 01:16:00 Delivery type: Vaginal, Spontaneous      named Francena Hanly Baby Feeding: Breast Disposition:home with mother   Delivery Report:  Review the Delivery Report for details.    Follow up:  Follow-up Information    Shea Evans, MD. Schedule an appointment as soon as possible for a visit in 6 week(s).   Specialty: Obstetrics and Gynecology Contact information: [address] [place] [address] [phone]                 Signed: Neta Mends, CNM, MSN 05/03/2020, 9:30 AM

## 2020-06-06 ENCOUNTER — Other Ambulatory Visit: Payer: Self-pay | Admitting: Obstetrics & Gynecology

## 2020-08-05 ENCOUNTER — Other Ambulatory Visit: Payer: Self-pay

## 2020-08-05 ENCOUNTER — Encounter: Payer: Self-pay | Admitting: Emergency Medicine

## 2020-08-05 ENCOUNTER — Ambulatory Visit
Admission: EM | Admit: 2020-08-05 | Discharge: 2020-08-05 | Disposition: A | Discharge status: Home / Self Care | Payer: No Typology Code available for payment source | Attending: Family Medicine | Admitting: Family Medicine

## 2020-08-05 DIAGNOSIS — B349 Viral infection, unspecified: Secondary | ICD-10-CM | POA: Diagnosis not present

## 2020-08-05 DIAGNOSIS — R000 Tachycardia, unspecified: Secondary | ICD-10-CM

## 2020-08-05 DIAGNOSIS — Z20822 Contact with and (suspected) exposure to covid-19: Secondary | ICD-10-CM

## 2020-08-05 NOTE — Discharge Instructions (Signed)
Your COVID and Influenza tests are pending.  You should self quarantine until the test results are back.    Take Tylenol or ibuprofen as needed for fever or discomfort.  Rest and keep yourself hydrated.    Follow-up with your primary care provider if your symptoms are not improving.     

## 2020-08-05 NOTE — ED Triage Notes (Signed)
Chills, body aches, headache since 4am this morning.

## 2020-08-05 NOTE — ED Provider Notes (Signed)
[facility]    CSN: [id number] Arrival date & time: 08/05/20  7989      History   Chief Complaint No chief complaint on file.   HPI Kara Simpson is a 30 y.o. female.   Reports chills, body aches, fatigue, headache since 4 AM this morning.  Has had positive COVID exposure at work.  Denies previous history of COVID.  Has not completed COVID vaccines.  Has not completed flu vaccine.  Denies cough, abdominal pain, nausea, vomiting, diarrhea, rash, fever, other symptoms.  ROS per HPI  The history is provided by the patient.    Past Medical History:  Diagnosis Date  . Contraceptive management 11/19/2012    Patient Active Problem List   Diagnosis Date Noted  . SVD (spontaneous vaginal delivery) 05/01/2020  . Obstetric labial and vaginal lacerations 05/01/2020  . GDM, class A1 05/01/2020  . Postpartum care following vaginal delivery 2/7 05/01/2020  . Encounter for induction of labor 04/30/2020    Past Surgical History:  Procedure Laterality Date  . WISDOM TOOTH EXTRACTION      OB History    Gravida  1   Para  1   Term  1   Preterm      AB      Living  1     SAB      IAB      Ectopic      Multiple  0   Live Births  1            Home Medications    Prior to Admission medications   Medication Sig Start Date End Date Taking? Authorizing Provider  Accu-Chek Softclix Lancets lancets USE AS DIRECTED TO TEST BLOOD SUGAR 4 TIMES DAILY 02/21/20 02/20/21  Shea Evans, MD  acetaminophen (TYLENOL) 500 MG tablet TAKE 2 TABLETS (1,000 MG TOTAL) BY MOUTH EVERY 6 (SIX) HOURS AS NEEDED. 05/03/20 05/03/21  Neta Mends, CNM  benzocaine-Menthol (DERMOPLAST) 20-0.5 % AERO Apply 1 application topically as needed for irritation (perineal discomfort). 05/03/20   Neta Mends, CNM  calcium carbonate (TUMS - DOSED IN MG ELEMENTAL CALCIUM) 500 MG chewable tablet Chew 1 tablet by mouth daily as needed for indigestion or heartburn.    [provider]  coconut oil OIL Apply 1 application topically as needed. 05/03/20   Arlan Organ C, CNM  glucose blood test strip USE TO TEST BLOOD SUGAR FOUR TIMES DAILY AS DIRECTED 02/21/20 02/20/21  Shea Evans, MD  ibuprofen (ADVIL) 600 MG tablet TAKE 1 TABLET BY MOUTH EVERY 6 (SIX) HOURS 05/03/20 05/03/21  Neta Mends, CNM  norethindrone (MICRONOR) 0.35 MG tablet TAKE 1 TABLET BY MOUTH ONCE DAILY 06/06/20 06/06/21  Shea Evans, MD  Prenatal Vit-Fe Fumarate-FA (PRENATAL MULTIVITAMIN) TABS tablet Take 1 tablet by mouth daily at 12 noon.    [provider]    Family History Family History  Problem Relation Age of Onset  . Heart attack Father     Social History Social History   Tobacco Use  . Smoking status: Never Smoker  . Smokeless tobacco: Never Used  Substance Use Topics  . Alcohol use: Yes    Comment: rarely  . Drug use: No     Allergies   Nickel   Review of Systems Review of Systems   Physical Exam Triage Vital Signs ED Triage Vitals  Enc Vitals Group     BP 08/05/20 0922 125/88     Pulse Rate 08/05/20 0922 (!) 117  Resp 08/05/20 0922 18     Temp 08/05/20 0922 98.5 F (36.9 C)     Temp Source 08/05/20 0922 Oral     SpO2 08/05/20 0922 97 %     Weight --      Height --      Head Circumference --      Peak Flow --      Pain Score 08/05/20 0923 8     Pain Loc --      Pain Edu? --      Excl. in GC? --    No data found.  Updated Vital Signs BP 125/88 (BP Location: Right Arm)   Pulse (!) 117   Temp 98.5 F (36.9 C) (Oral)   Resp 18   SpO2 97%    Physical Exam Vitals and nursing note reviewed.  Constitutional:      General: She is not in acute distress.    Appearance: She is well-developed and normal weight. She is ill-appearing.  HENT:     Head: Normocephalic and atraumatic.     Right Ear: Tympanic membrane, ear canal and external ear normal.     Left Ear: Tympanic membrane, ear canal and external ear normal.     Nose:  Congestion present.     Mouth/Throat:     Mouth: Mucous membranes are moist.     Pharynx: Oropharynx is clear.  Eyes:     Extraocular Movements: Extraocular movements intact.     Conjunctiva/sclera: Conjunctivae normal.     Pupils: Pupils are equal, round, and reactive to light.  Cardiovascular:     Rate and Rhythm: Regular rhythm. Tachycardia present.     Heart sounds: Normal heart sounds. No murmur heard.   Pulmonary:     Effort: Pulmonary effort is normal. No respiratory distress.     Breath sounds: Normal breath sounds. No stridor. No wheezing, rhonchi or rales.  Chest:     Chest wall: No tenderness.  Abdominal:     General: Bowel sounds are normal.     Palpations: Abdomen is soft.     Tenderness: There is no abdominal tenderness.  Musculoskeletal:        General: Normal range of motion.     Cervical back: Normal range of motion and neck supple.  Lymphadenopathy:     Cervical: Cervical adenopathy present.  Skin:    General: Skin is warm and dry.     Capillary Refill: Capillary refill takes less than 2 seconds.  Neurological:     General: No focal deficit present.     Mental Status: She is alert and oriented to person, place, and time.  Psychiatric:        Mood and Affect: Mood normal.        Behavior: Behavior normal.        Thought Content: Thought content normal.      UC Treatments / Results  Labs (all labs ordered are listed, but only abnormal results are displayed) Labs Reviewed  COVID-19, FLU A+B NAA    EKG   Radiology No results found.  Procedures Procedures (including critical care time)  Medications Ordered in UC Medications - No data to display  Initial Impression / Assessment and Plan / UC Course  I have reviewed the triage vital signs and the nursing notes.  Pertinent labs & imaging results that were available during my care of the patient were reviewed by me and considered in my medical decision making (see chart for details).  Viral  Illness Covid exposure  Discussed increasing fluids and pumping since you are breastfeeding Wear a mask around the baby to hep prevent viral transmission Work note provided Covid swab obtained in office today.   Patient instructed to quarantine until results are back and negative.   If results are negative, patient may resume daily schedule as tolerated once they are fever free for 24 hours without the use of antipyretic medications.   If results are positive, patient instructed to quarantine for at least 5 days from symptom onset.  If after 5 days symptoms have resolved, may return to work with a well fitting mask for the next 5 days. If symptomatic after day 5, isolation should be extended to 10 days. Patient instructed to follow-up with primary care or with this office as needed.   Patient instructed to follow-up in the ER for trouble swallowing, trouble breathing, other concerning symptoms.   Final Clinical Impressions(s) / UC Diagnoses   Final diagnoses:  Exposure to COVID-19 virus  Viral illness  Tachycardia     Discharge Instructions     Your COVID and Influenza tests are pending.  You should self quarantine until the test results are back.    Take Tylenol or ibuprofen as needed for fever or discomfort.  Rest and keep yourself hydrated.    Follow-up with your primary care provider if your symptoms are not improving.        ED Prescriptions    None     PDMP not reviewed this encounter.   Moshe Cipro, NP 08/05/20 [phone]

## 2020-08-07 LAB — COVID-19, FLU A+B NAA
Influenza A, NAA: NOT DETECTED
Influenza B, NAA: NOT DETECTED
SARS-CoV-2, NAA: DETECTED — AB

## 2021-03-25 NOTE — L&D Delivery Note (Signed)
Delivery Note At 8:38 AM a viable and healthy female was delivered via Vaginal, Spontaneous (Presentation: LOA ).  APGAR: 9, 9; weight  pending .   Placenta status: Spontaneous, Intact.  Cord:   with the following complications:  .  Cord pH: NA  Anesthesia: Epidural Episiotomy:  NA Lacerations:  None Est. Blood Loss (mL):   300  cc  Mom to postpartum.  Baby to Couplet care / Skin to Skin.  Robley Fries 03/08/2022, 9:14 AM

## 2021-08-31 LAB — OB RESULTS CONSOLE GC/CHLAMYDIA
Chlamydia: NEGATIVE
Neisseria Gonorrhea: NEGATIVE

## 2021-08-31 LAB — OB RESULTS CONSOLE HIV ANTIBODY (ROUTINE TESTING): HIV: NONREACTIVE

## 2021-08-31 LAB — OB RESULTS CONSOLE HEPATITIS B SURFACE ANTIGEN: Hepatitis B Surface Ag: NEGATIVE

## 2021-08-31 LAB — OB RESULTS CONSOLE RUBELLA ANTIBODY, IGM: Rubella: IMMUNE

## 2021-08-31 LAB — OB RESULTS CONSOLE VARICELLA ZOSTER ANTIBODY, IGG: Varicella: IMMUNE

## 2021-08-31 LAB — OB RESULTS CONSOLE RPR: RPR: NONREACTIVE

## 2021-08-31 LAB — HEPATITIS C ANTIBODY: HCV Ab: NEGATIVE

## 2022-01-04 LAB — OB RESULTS CONSOLE RPR: RPR: NONREACTIVE

## 2022-01-24 NOTE — Progress Notes (Signed)
Cardio-Obstetrics Clinic  New Evaluation  Date:  01/25/2022   ID:  Kara Simpson, DOB 1991/03/21, MRN [id number]  PCP:  Merrilee Seashore, MD   [facility] Providers Cardiologist:  None  Electrophysiologist:  None       Referring MD: Merrilee Seashore, MD   Chief Complaint: palpitations  History of Present Illness:    Kara Simpson is a 32 y.o. female [G2P1001] who is being seen today for the evaluation of palpitations at the request of Merrilee Seashore, MD.   Patient was seen by Dr. Despina Pole on 01/01/22 where she was having palpitations and chest pain. Note reviewed. Given symptoms, she was referred to cardio-OB clinic for further evaluation.  Today, the patient is [redacted]w[redacted]d pregnant. States that she had an episode of palpitations with associated SOB, lightheadedness and chest tightness that lasted about 20-36min before resolving. Had 2 episodes since that time which were shorter in duration. Has not had an episode in over a week. Nothing seemed to trigger the palpitations. Has not happened to her prior to pregnancy.  Of note, had some uterine contractions yesterday but this has resolved today. No vaginal bleeding/leakage of fluid.  Had gestational DM during prior pregnancy. No current gestational DM, hypertension. No history pre-eclampsia. Monitoring her BG and they have been well controlled.   Prior CV Studies Reviewed: The following studies were reviewed today: No CV procedures  Past Medical History:  Diagnosis Date   Contraceptive management 11/19/2012    Past Surgical History:  Procedure Laterality Date   WISDOM TOOTH EXTRACTION        OB History     Gravida  2   Para  1   Term  1   Preterm      AB      Living  1      SAB      IAB      Ectopic      Multiple  0   Live Births  1               Current Medications: Current Meds  Medication Sig   benzocaine-Menthol (DERMOPLAST) 20-0.5 % AERO Apply 1 application  topically as needed for irritation (perineal discomfort).   calcium carbonate (TUMS - DOSED IN MG ELEMENTAL CALCIUM) 500 MG chewable tablet Chew 1 tablet by mouth daily as needed for indigestion or heartburn.   coconut oil OIL Apply 1 application topically as needed.   Prenatal Vit-Fe Fumarate-FA (PRENATAL MULTIVITAMIN) TABS tablet Take 1 tablet by mouth daily at 12 noon.     Allergies:   Nickel   Social History   Socioeconomic History   Marital status: Married    Spouse name: Not on file   Number of children: Not on file   Years of education: Not on file   Highest education level: Not on file  Occupational History   Not on file  Tobacco Use   Smoking status: Never   Smokeless tobacco: Never  Substance and Sexual Activity   Alcohol use: Yes    Comment: rarely   Drug use: No   Sexual activity: Yes    Birth control/protection: Pill  Other Topics Concern   Not on file  Social History Narrative   Not on file   Social Determinants of Health   Financial Resource Strain: Not on file  Food Insecurity: No Food Insecurity (03/06/2020)   Hunger Vital Sign    Worried About Running Out of Food in the Last Year: Never true  Ran Out of Food in the Last Year: Never true  Transportation Needs: Not on file  Physical Activity: Not on file  Stress: Not on file  Social Connections: Not on file      Family History  Problem Relation Age of Onset   Heart attack Father       ROS:   Please see the history of present illness.     All other systems reviewed and are negative.   Labs/EKG Reviewed:    EKG:   EKG is  ordered today.  The ekg ordered today demonstrates NSR with HR 85  Recent Labs: No results found for requested labs within last 365 days.   Recent Lipid Panel Lab Results  Component Value Date/Time   CHOL 148 02/11/2017 07:56 AM   TRIG 41 02/11/2017 07:56 AM   HDL 59 02/11/2017 07:56 AM   CHOLHDL 2.5 02/11/2017 07:56 AM   LDLCALC 81 02/11/2017 07:56 AM     Physical Exam:    VS:  BP 116/64   Pulse 84   Ht 5\' 5"  (1.651 m)   Wt 193 lb (87.5 kg)   SpO2 100%   BMI 32.12 kg/m     Wt Readings from Last 3 Encounters:  01/25/22 193 lb (87.5 kg)  04/30/20 189 lb 14.4 oz (86.1 kg)  11/03/17 160 lb (72.6 kg)     GEN:  Well nourished, well developed in no acute distress HEENT: Normal NECK: No JVD; No carotid bruits CARDIAC: RRR, 2/6 systolic murmur RESPIRATORY:  Clear to auscultation without rales, wheezing or rhonchi  ABDOMEN: Soft, non-tender, non-distended MUSCULOSKELETAL:  No edema; No deformity  SKIN: Warm and dry NEUROLOGIC:  Alert and oriented x 3 PSYCHIATRIC:  Normal affect    Risk Assessment/Risk Calculators:     ASSESSMENT & PLAN:    #Palpitations: #Chest Pain: Had 3 total episodes of palpitations with associated chest tightness, SOB and lightheadedness. Longest lasted about 20-39min before resolving. Symptoms are concerning for possible SVT but will check zio monitor for further evaluation. Discussed vagal maneuvers as well to see if that helps.  -Check 14d zio monitor   Patient Instructions  Medication Instructions:  Your physician recommends that you continue on your current medications as directed. Please refer to the Current Medication list given to you today.  *If you need a refill on your cardiac medications before your next appointment, please call your pharmacy*  Lab Work: If you have labs (blood work) drawn today and your tests are completely normal, you will receive your results only by: MyChart Message (if you have MyChart) OR A paper copy in the mail If you have any lab test that is abnormal or we need to change your treatment, we will call you to review the results.  Testing/Procedures: Your physician has recommended that you wear a zio monitor. Zio monitors are medical devices that record the heart's electrical activity. Doctors most often 31m these monitors to diagnose arrhythmias. Arrhythmias are  problems with the speed or rhythm of the heartbeat. The monitor is a small, portable device. You can wear one while you do your normal daily activities. This is usually used to diagnose what is causing palpitations/syncope (passing out).  Follow-Up: At [facility], you and your health needs are our priority.  As part of our continuing mission to provide you with exceptional heart care, we have created designated Provider Care Teams.  These Care Teams include your primary Cardiologist (physician) and Advanced Practice Providers (APPs -  Physician Assistants  and Nurse Practitioners) who all work together to provide you with the care you need, when you need it.  We recommend signing up for the patient portal called "MyChart".  Sign up information is provided on this After Visit Summary.  MyChart is used to connect with patients for Virtual Visits (Telemedicine).  Patients are able to view lab/test results, encounter notes, upcoming appointments, etc.  Non-urgent messages can be sent to your provider as well.   To learn more about what you can do with MyChart, go to [email or web address].    Your next appointment:   As needed  The format for your next appointment:   In Person  Provider:   Dr. Harriett Rush- Long Term Monitor Instructions  Your physician has requested you wear a ZIO patch monitor for 14 days.  This is a single patch monitor. [employer] supplies one patch monitor per enrollment. Additional stickers are not available. Please do not apply patch if you will be having a Nuclear Stress Test,  Echocardiogram, Cardiac CT, MRI, or Chest Xray during the period you would be wearing the  monitor. The patch cannot be worn during these tests. You cannot remove and re-apply the  ZIO XT patch monitor.  Your ZIO patch monitor will be mailed 3 day USPS to your address on file. It may take 3-5 days  to receive your monitor after you have been enrolled.  Once you have received  your monitor, please review the enclosed instructions. Your monitor  has already been registered assigning a specific monitor serial # to you.  Billing and Patient Assistance Program Information  We have supplied [employer] with any of your insurance information on file for billing purposes. [employer] offers a sliding scale Patient Assistance Program for patients that do not have  insurance, or whose insurance does not completely cover the cost of the ZIO monitor.  You must apply for the Patient Assistance Program to qualify for this discounted rate.  To apply, please call [employer] at [phone], select option 4, select option 2, ask to apply for  Patient Assistance Program. Meredeth Ide will ask your household income, and how many people  are in your household. They will quote your out-of-pocket cost based on that information.  [employer] will also be able to set up a 55-month, interest-free payment plan if needed.  Applying the monitor   Shave hair from upper left chest.  Hold abrader disc by orange tab. Rub abrader in 40 strokes over the upper left chest as  indicated in your monitor instructions.  Clean area with 4 enclosed alcohol pads. Let dry.  Apply patch as indicated in monitor instructions. Patch will be placed under collarbone on left  side of chest with arrow pointing upward.  Rub patch adhesive wings for 2 minutes. Remove white label marked "1". Remove the white  label marked "2". Rub patch adhesive wings for 2 additional minutes.  While looking in a mirror, press and release button in center of patch. A small green light will  flash 3-4 times. This will be your only indicator that the monitor has been turned on.  Do not shower for the first 24 hours. You may shower after the first 24 hours.  Press the button if you feel a symptom. You will hear a small click. Record Date, Time and  Symptom in the Patient Logbook.  When you are ready to remove the patch, follow instructions on the last  2 pages of Patient  Logbook. Stick  patch monitor onto the last page of Patient Logbook.  Place Patient Logbook in the blue and white box. Use locking tab on box and tape box closed  securely. The blue and white box has prepaid postage on it. Please place it in the mailbox as  soon as possible. Your physician should have your test results approximately 7 days after the  monitor has been mailed back to [facility].  Call [facility] Customer Care at [phone] if you have questions regarding  your ZIO XT patch monitor. Call them immediately if you see an orange light blinking on your  monitor.  If your monitor falls off in less than 4 days, contact our Monitor department at [phone].  If your monitor becomes loose or falls off after 4 days call [employer] at [phone] for  suggestions on securing your monitor   Important Information About Sugar         Dispo:  No follow-ups on file.   Medication Adjustments/Labs and Tests Ordered: Current medicines are reviewed at length with the patient today.  Concerns regarding medicines are outlined above.  Tests Ordered: Orders Placed This Encounter  Procedures   LONG TERM MONITOR (3-14 DAYS)   EKG 12-Lead   Medication Changes: No orders of the defined types were placed in this encounter.

## 2022-01-25 ENCOUNTER — Encounter: Payer: Self-pay | Admitting: Cardiology

## 2022-01-25 ENCOUNTER — Ambulatory Visit: Payer: No Typology Code available for payment source | Attending: Cardiology

## 2022-01-25 ENCOUNTER — Ambulatory Visit (INDEPENDENT_AMBULATORY_CARE_PROVIDER_SITE_OTHER): Payer: Commercial Managed Care - PPO | Admitting: Cardiology

## 2022-01-25 VITALS — BP 116/64 | HR 84 | Ht 65.0 in | Wt 193.0 lb

## 2022-01-25 DIAGNOSIS — R002 Palpitations: Secondary | ICD-10-CM

## 2022-01-25 NOTE — Patient Instructions (Addendum)
Medication Instructions:  Your physician recommends that you continue on your current medications as directed. Please refer to the Current Medication list given to you today.  *If you need a refill on your cardiac medications before your next appointment, please call your pharmacy*  Lab Work: If you have labs (blood work) drawn today and your tests are completely normal, you will receive your results only by: [place] (if you have MyChart) OR A paper copy in the mail If you have any lab test that is abnormal or we need to change your treatment, we will call you to review the results.  Testing/Procedures: Your physician has recommended that you wear a zio monitor. Zio monitors are medical devices that record the heart's electrical activity. Doctors most often [place] these monitors to diagnose arrhythmias. Arrhythmias are problems with the speed or rhythm of the heartbeat. The monitor is a small, portable device. You can wear one while you do your normal daily activities. This is usually used to diagnose what is causing palpitations/syncope (passing out).  Follow-Up: At [facility], you and your health needs are our priority.  As part of our continuing mission to provide you with exceptional heart care, we have created designated Provider Care Teams.  These Care Teams include your primary Cardiologist (physician) and Advanced Practice Providers (APPs -  Physician Assistants and Nurse Practitioners) who all work together to provide you with the care you need, when you need it.  We recommend signing up for the patient portal called "MyChart".  Sign up information is provided on this After Visit Summary.  MyChart is used to connect with patients for Virtual Visits (Telemedicine).  Patients are able to view lab/test results, encounter notes, upcoming appointments, etc.  Non-urgent messages can be sent to your provider as well.   To learn more about what you can do with MyChart, go to  [email or web address].    Your next appointment:   As needed  The format for your next appointment:   In Person  Provider:   Dr. Christy Gentles- Long Term Monitor Instructions  Your physician has requested you wear a ZIO patch monitor for 14 days.  This is a single patch monitor. [employer] supplies one patch monitor per enrollment. Additional stickers are not available. Please do not apply patch if you will be having a Nuclear Stress Test,  Echocardiogram, Cardiac CT, MRI, or Chest Xray during the period you would be wearing the  monitor. The patch cannot be worn during these tests. You cannot remove and re-apply the  ZIO XT patch monitor.  Your ZIO patch monitor will be mailed 3 day USPS to your address on file. It may take 3-5 days  to receive your monitor after you have been enrolled.  Once you have received your monitor, please review the enclosed instructions. Your monitor  has already been registered assigning a specific monitor serial # to you.  Billing and Patient Assistance Program Information  We have supplied [employer] with any of your insurance information on file for billing purposes. [employer] offers a sliding scale Patient Assistance Program for patients that do not have  insurance, or whose insurance does not completely cover the cost of the ZIO monitor.  You must apply for the Patient Assistance Program to qualify for this discounted rate.  To apply, please call [employer] at [phone], select option 4, select option 2, ask to apply for  Patient Assistance Program. Theodore Demark will ask your household income, and how  many people  are in your household. They will quote your out-of-pocket cost based on that information.  [employer] will also be able to set up a 75-month, interest-free payment plan if needed.  Applying the monitor   Shave hair from upper left chest.  Hold abrader disc by orange tab. Rub abrader in 40 strokes over the upper left chest as  indicated in  your monitor instructions.  Clean area with 4 enclosed alcohol pads. Let dry.  Apply patch as indicated in monitor instructions. Patch will be placed under collarbone on left  side of chest with arrow pointing upward.  Rub patch adhesive wings for 2 minutes. Remove white label marked "1". Remove the white  label marked "2". Rub patch adhesive wings for 2 additional minutes.  While looking in a mirror, press and release button in center of patch. A small green light will  flash 3-4 times. This will be your only indicator that the monitor has been turned on.  Do not shower for the first 24 hours. You may shower after the first 24 hours.  Press the button if you feel a symptom. You will hear a small click. Record Date, Time and  Symptom in the Patient Logbook.  When you are ready to remove the patch, follow instructions on the last 2 pages of Patient  Logbook. Stick patch monitor onto the last page of Patient Logbook.  Place Patient Logbook in the blue and white box. Use locking tab on box and tape box closed  securely. The blue and white box has prepaid postage on it. Please place it in the mailbox as  soon as possible. Your physician should have your test results approximately 7 days after the  monitor has been mailed back to [facility].  Call [place] at [phone] if you have questions regarding  your ZIO XT patch monitor. Call them immediately if you see an orange light blinking on your  monitor.  If your monitor falls off in less than 4 days, contact our Monitor department at [phone].  If your monitor becomes loose or falls off after 4 days call [employer] at [phone] for  suggestions on securing your monitor   Important Information About Sugar

## 2022-01-25 NOTE — Progress Notes (Unsigned)
Enrolled patient for a 14 day Zio XT  monitor to be mailed to patients home  °

## 2022-01-30 DIAGNOSIS — R002 Palpitations: Secondary | ICD-10-CM

## 2022-02-27 LAB — OB RESULTS CONSOLE GBS: GBS: POSITIVE

## 2022-03-07 ENCOUNTER — Encounter (HOSPITAL_COMMUNITY): Payer: Self-pay

## 2022-03-07 ENCOUNTER — Inpatient Hospital Stay (HOSPITAL_COMMUNITY)
Admission: AD | Admit: 2022-03-07 | Discharge: 2022-03-09 | DRG: 807 | Disposition: A | Discharge status: Home / Self Care | Payer: Commercial Managed Care - PPO | Attending: Obstetrics & Gynecology | Admitting: Obstetrics & Gynecology

## 2022-03-07 ENCOUNTER — Other Ambulatory Visit: Payer: Self-pay

## 2022-03-07 DIAGNOSIS — O4292 Full-term premature rupture of membranes, unspecified as to length of time between rupture and onset of labor: Secondary | ICD-10-CM | POA: Diagnosis present

## 2022-03-07 DIAGNOSIS — Z3A37 37 weeks gestation of pregnancy: Secondary | ICD-10-CM | POA: Diagnosis not present

## 2022-03-07 DIAGNOSIS — O99824 Streptococcus B carrier state complicating childbirth: Secondary | ICD-10-CM | POA: Diagnosis present

## 2022-03-07 DIAGNOSIS — O26893 Other specified pregnancy related conditions, third trimester: Secondary | ICD-10-CM | POA: Diagnosis present

## 2022-03-07 LAB — HIV ANTIBODY (ROUTINE TESTING W REFLEX): HIV Screen 4th Generation wRfx: NONREACTIVE

## 2022-03-07 LAB — CBC
HCT: 36.5 % (ref 36.0–46.0)
Hemoglobin: 12.3 g/dL (ref 12.0–15.0)
MCH: 28.5 pg (ref 26.0–34.0)
MCHC: 33.7 g/dL (ref 30.0–36.0)
MCV: 84.5 fL (ref 80.0–100.0)
Platelets: 190 10*3/uL (ref 150–400)
RBC: 4.32 MIL/uL (ref 3.87–5.11)
RDW: 13.2 % (ref 11.5–15.5)
WBC: 8 10*3/uL (ref 4.0–10.5)
nRBC: 0 % (ref 0.0–0.2)

## 2022-03-07 LAB — TYPE AND SCREEN
ABO/RH(D): O POS
Antibody Screen: NEGATIVE

## 2022-03-07 MED ORDER — LACTATED RINGERS IV SOLN
INTRAVENOUS | Status: DC
Start: 2022-03-07 — End: 2022-03-08

## 2022-03-07 MED ORDER — DIPHENHYDRAMINE HCL 50 MG/ML IJ SOLN: 12.5000 mg | INTRAMUSCULAR | Status: DC | PRN

## 2022-03-07 MED ORDER — OXYCODONE-ACETAMINOPHEN 5-325 MG PO TABS: 2.0000 | ORAL_TABLET | ORAL | Status: DC | PRN

## 2022-03-07 MED ORDER — OXYCODONE-ACETAMINOPHEN 5-325 MG PO TABS: 1.0000 | ORAL_TABLET | ORAL | Status: DC | PRN

## 2022-03-07 MED ORDER — PHENYLEPHRINE 80 MCG/ML (10ML) SYRINGE FOR IV PUSH (FOR BLOOD PRESSURE SUPPORT): 80.0000 ug | PREFILLED_SYRINGE | INTRAVENOUS | Status: DC | PRN

## 2022-03-07 MED ORDER — LACTATED RINGERS IV SOLN: 500.0000 mL | INTRAVENOUS | Status: DC | PRN

## 2022-03-07 MED ORDER — SODIUM CHLORIDE 0.9 % IV SOLN
5.0000 10*6.[IU] | Freq: Once | INTRAVENOUS | Status: AC
  Administered 2022-03-07: 5 10*6.[IU] via INTRAVENOUS
  Filled 2022-03-07: qty 5

## 2022-03-07 MED ORDER — EPHEDRINE 5 MG/ML INJ: 10.0000 mg | INTRAVENOUS | Status: DC | PRN

## 2022-03-07 MED ORDER — LACTATED RINGERS IV SOLN
500.0000 mL | Freq: Once | INTRAVENOUS | Status: AC
  Administered 2022-03-08: 500 mL via INTRAVENOUS

## 2022-03-07 MED ORDER — TERBUTALINE SULFATE 1 MG/ML IJ SOLN: 0.2500 mg | Freq: Once | INTRAMUSCULAR | Status: DC | PRN

## 2022-03-07 MED ORDER — ONDANSETRON HCL 4 MG/2ML IJ SOLN: 4.0000 mg | Freq: Four times a day (QID) | INTRAMUSCULAR | Status: DC | PRN

## 2022-03-07 MED ORDER — PENICILLIN G POT IN DEXTROSE 60000 UNIT/ML IV SOLN
3.0000 10*6.[IU] | INTRAVENOUS | Status: DC
  Administered 2022-03-07 – 2022-03-08 (×5): 3 10*6.[IU] via INTRAVENOUS
  Filled 2022-03-07 (×5): qty 50

## 2022-03-07 MED ORDER — FENTANYL-BUPIVACAINE-NACL 0.5-0.125-0.9 MG/250ML-% EP SOLN
12.0000 mL/h | EPIDURAL | Status: DC | PRN
  Administered 2022-03-08: 12 mL/h via EPIDURAL
  Filled 2022-03-07: qty 250

## 2022-03-07 MED ORDER — OXYTOCIN BOLUS FROM INFUSION
333.0000 mL | Freq: Once | INTRAVENOUS | Status: AC
  Administered 2022-03-08: 333 mL via INTRAVENOUS

## 2022-03-07 MED ORDER — OXYTOCIN-SODIUM CHLORIDE 30-0.9 UT/500ML-% IV SOLN
1.0000 m[IU]/min | INTRAVENOUS | Status: DC
  Administered 2022-03-07: 1 m[IU]/min via INTRAVENOUS

## 2022-03-07 MED ORDER — LIDOCAINE HCL (PF) 1 % IJ SOLN
30.0000 mL | INTRAMUSCULAR | Status: DC | PRN
  Filled 2022-03-07: qty 30

## 2022-03-07 MED ORDER — FENTANYL CITRATE (PF) 100 MCG/2ML IJ SOLN: 50.0000 ug | INTRAMUSCULAR | Status: DC | PRN

## 2022-03-07 MED ORDER — SOD CITRATE-CITRIC ACID 500-334 MG/5ML PO SOLN: 30.0000 mL | ORAL | Status: DC | PRN

## 2022-03-07 MED ORDER — OXYTOCIN-SODIUM CHLORIDE 30-0.9 UT/500ML-% IV SOLN
2.5000 [IU]/h | INTRAVENOUS | Status: DC
  Administered 2022-03-08: 2.5 [IU]/h via INTRAVENOUS
  Filled 2022-03-07: qty 500

## 2022-03-07 MED ORDER — ACETAMINOPHEN 325 MG PO TABS: 650.0000 mg | ORAL_TABLET | ORAL | Status: DC | PRN

## 2022-03-07 NOTE — Progress Notes (Addendum)
Kara Simpson is a 31 y.o. G2P1001 at [redacted]w[redacted]d by 1st trim sono c/w LMP. Admitted w/ SROM at 6.30 AM No in active labor yet.   Subjective: Slight cramping in back and around the waist, No strong UCs yet. 7 hours since SROM.  Objective: BP 118/72   Pulse 95   Temp 97.7 F (36.5 C) (Oral)   Resp 20   Ht 5\' 5"  (1.651 m)   Wt 93.3 kg   BMI 34.21 kg/m   FHT:  FHR: 140 bpm, variability: moderate,  accelerations:  Present,  decelerations:  Absent UC:   irregular, no pattern picked up yet  SVE:   deferred   Labs: Lab Results  Component Value Date   WBC 8.0 03/07/2022   HGB 12.3 03/07/2022   HCT 36.5 03/07/2022   MCV 84.5 03/07/2022   PLT 190 03/07/2022    Assessment / Plan: SROM 37 wks. G2P1001 with prior SVD,  GBS +. No active labor   FHR cat I  PCN per protocol for GBS  Start low dose pitocin to keep up with progress since no active labor pattern at 7 hrs from SROM.  Anticipated MOD:  NSVD  EFW 7 lbs   03/09/2022, MD 03/07/2022, 2:58 PM

## 2022-03-07 NOTE — H&P (Signed)
Kara Simpson is a 31 y.o. female G2P1001 [redacted]w[redacted]d presenting for SROM. Patient reported some light pink spotting around 6AM this morning. She was seen and evaluated in the office where she had ROM evaluation positive. Some moderate cramping but no regular or painful CTXs. Good FM.  Pregnancy dated by 6w sono not c/w LMP (had been BF at time of conception). Routine prenatal care at [facility] with Kara Simpson as primary provider. Routine prenatal labs WNL. Patient had a low risk NIPT, negative AFP1 screen, and normal fetal anatomy scan. Patient with history of GDMA1 in previous pregnancy but normal 1hr GTT 131 this time. Growth US done at 31w for S>D showed vertex presentation with an EFW 4#5 (63%) and AC 78%. Positive GBS with NKDA. Previous SVD at term uncomplicated pelvis proven to 7 pounds.  Patient accompanied by her husband Kara Simpson and expecting a baby boy 'Kara Simpson'  OB History     Gravida  2   Para  1   Term  1   Preterm      AB      Living  1      SAB      IAB      Ectopic      Multiple  0   Live Births  1          Past Medical History:  Diagnosis Date   Contraceptive management 11/19/2012   Past Surgical History:  Procedure Laterality Date   WISDOM TOOTH EXTRACTION     Family History: family history includes Heart attack in her father. Social History:  reports that she has never smoked. She has never used smokeless tobacco. She reports current alcohol use. She reports that she does not use drugs.     Maternal Diabetes: No Genetic Screening: Normal Maternal Ultrasounds/Referrals: Normal Fetal Ultrasounds or other Referrals:  None Maternal Substance Abuse:  No Significant Maternal Medications:  None Significant Maternal Lab Results:  Group B Strep positive Other Comments:  None  Review of Systems  All other systems reviewed and are negative.  Per HPI Maternal Exam:  Uterine Assessment: Contraction frequency is rare.  Abdomen: Patient reports no  abdominal tenderness. Estimated fetal weight is 7.5.   Fetal presentation: vertex Introitus: Normal vulva. Amniotic fluid character: clear. Pelvis: adequate for delivery.     Fetal Exam Fetal Monitor Review: Baseline rate: 130.  Variability: moderate (6-25 bpm).   Pattern: accelerations present and no decelerations.   Fetal State Assessment: Category I - tracings are normal.   Physical Exam Vitals reviewed.  Constitutional:      Appearance: Normal appearance.  HENT:     Head: Normocephalic.     Nose: Nose normal.  Cardiovascular:     Rate and Rhythm: Normal rate.  Pulmonary:     Effort: Pulmonary effort is normal.  Genitourinary:    General: Normal vulva.  Musculoskeletal:     Cervical back: Normal range of motion.  Skin:    General: Skin is warm.  Neurological:     General: No focal deficit present.     Mental Status: She is alert and oriented to person, place, and time.  Psychiatric:        Mood and Affect: Mood normal.        Behavior: Behavior normal.       Blood pressure 138/76, pulse 86, temperature 97.9 F (36.6 C), temperature source Oral, resp. rate 20, height 5\' 5"  (1.651 m), weight 93.3 kg, unknown if currently breastfeeding.  Prenatal labs:  ABO, Rh:  --/--/O POS (12/14 1034) Antibody: NEG (12/14 1034) Rubella: Immune (06/09 0000) RPR: Nonreactive (10/13 0000)  HBsAg: Negative (06/09 0000)  HIV: Non-reactive (06/09 0000)  GBS: Positive/-- (12/06 0000)   ChemistryNo results for input(s): "NA", "K", "CL", "CO2", "GLUCOSE", "BUN", "CREATININE", "CALCIUM", "GFRNONAA", "GFRAA", "ANIONGAP" in the last 168 hours.  No results for input(s): "PROT", "ALBUMIN", "AST", "ALT", "ALKPHOS", "BILITOT" in the last 168 hours. Hematology Recent Labs  Lab 03/07/22 1034  WBC 8.0  RBC 4.32  HGB 12.3  HCT 36.5  MCV 84.5  MCH 28.5  MCHC 33.7  RDW 13.2  PLT 190   Cardiac EnzymesNo results for input(s): "TROPONINI" in the last 168 hours. No results for input(s):  "TROPIPOC" in the last 168 hours.  BNPNo results for input(s): "BNP", "PROBNP" in the last 168 hours.  DDimer No results for input(s): "DDIMER" in the last 168 hours.   Assessment/Plan: Kara Simpson is a 31 y.o. female G2P1001 [redacted]w[redacted]d admitted with SROM.   -Admission to LD -Routine admission labs -PCN started for +GBS, cont q4hr intrapartum protocol -SROM, cervix checked in office this morning 1 cm 50%, starting to feel more ctxs: will give more time for antibiotics to get on board and see if labor progresses: expectant mgmt for now -Cont EFM/Toco -Pt desires unmedicated birth but open to Epidural if needed -Routine intrapartum care -Anticipate NSVD  Kara Simpson 03/07/2022, 12:35 PM

## 2022-03-08 ENCOUNTER — Encounter (HOSPITAL_COMMUNITY): Payer: Self-pay | Admitting: Obstetrics and Gynecology

## 2022-03-08 ENCOUNTER — Inpatient Hospital Stay (HOSPITAL_COMMUNITY): Payer: Commercial Managed Care - PPO | Admitting: Anesthesiology

## 2022-03-08 LAB — RPR: RPR Ser Ql: NONREACTIVE

## 2022-03-08 MED ORDER — TETANUS-DIPHTH-ACELL PERTUSSIS 5-2.5-18.5 LF-MCG/0.5 IM SUSY: 0.5000 mL | PREFILLED_SYRINGE | Freq: Once | INTRAMUSCULAR | Status: DC

## 2022-03-08 MED ORDER — DIPHENHYDRAMINE HCL 25 MG PO CAPS: 25.0000 mg | ORAL_CAPSULE | Freq: Four times a day (QID) | ORAL | Status: DC | PRN

## 2022-03-08 MED ORDER — ACETAMINOPHEN 325 MG PO TABS: 650.0000 mg | ORAL_TABLET | ORAL | Status: DC | PRN

## 2022-03-08 MED ORDER — ZOLPIDEM TARTRATE 5 MG PO TABS: 5.0000 mg | ORAL_TABLET | Freq: Every evening | ORAL | Status: DC | PRN

## 2022-03-08 MED ORDER — IBUPROFEN 600 MG PO TABS
600.0000 mg | ORAL_TABLET | Freq: Four times a day (QID) | ORAL | Status: DC
  Filled 2022-03-08: qty 1

## 2022-03-08 MED ORDER — DIBUCAINE (PERIANAL) 1 % EX OINT: 1.0000 | TOPICAL_OINTMENT | CUTANEOUS | Status: DC | PRN

## 2022-03-08 MED ORDER — ONDANSETRON HCL 4 MG PO TABS: 4.0000 mg | ORAL_TABLET | ORAL | Status: DC | PRN

## 2022-03-08 MED ORDER — SIMETHICONE 80 MG PO CHEW: 80.0000 mg | CHEWABLE_TABLET | ORAL | Status: DC | PRN

## 2022-03-08 MED ORDER — PRENATAL MULTIVITAMIN CH
1.0000 | ORAL_TABLET | Freq: Every day | ORAL | Status: DC
  Filled 2022-03-08: qty 1

## 2022-03-08 MED ORDER — BENZOCAINE-MENTHOL 20-0.5 % EX AERO
1.0000 | INHALATION_SPRAY | CUTANEOUS | Status: DC | PRN
  Filled 2022-03-08: qty 56

## 2022-03-08 MED ORDER — OXYTOCIN-SODIUM CHLORIDE 30-0.9 UT/500ML-% IV SOLN: 1.0000 m[IU]/min | INTRAVENOUS | Status: DC

## 2022-03-08 MED ORDER — ONDANSETRON HCL 4 MG/2ML IJ SOLN: 4.0000 mg | INTRAMUSCULAR | Status: DC | PRN

## 2022-03-08 MED ORDER — LIDOCAINE HCL (PF) 1 % IJ SOLN
INTRAMUSCULAR | Status: DC | PRN
  Administered 2022-03-08 (×2): 4 mL via EPIDURAL

## 2022-03-08 MED ORDER — WITCH HAZEL-GLYCERIN EX PADS: 1.0000 | MEDICATED_PAD | CUTANEOUS | Status: DC | PRN

## 2022-03-08 MED ORDER — COCONUT OIL OIL
1.0000 | TOPICAL_OIL | Status: DC | PRN
  Administered 2022-03-09: 1 via TOPICAL

## 2022-03-08 MED ORDER — SENNOSIDES-DOCUSATE SODIUM 8.6-50 MG PO TABS: 2.0000 | ORAL_TABLET | Freq: Every day | ORAL | Status: DC

## 2022-03-08 NOTE — Lactation Note (Signed)
This note was copied from a baby's chart. Lactation Consultation Note  Patient Name: Kara Simpson HWKGS'U Date: 03/08/2022 Reason for consult: L&D Initial assessment;Early term 37-38.6wks;Breastfeeding assistance (LC L/D visit at 35 mins PP, Baby STS on mnoms chest, wide awake, rooting. LC offered to assist to latch, mom receptive. Baby Latched easily with depth, and fed 5 mins , swallows. re-latched and still feeding.) Age:29 mins  LC recommended when mom is transferred to [facility] due to the areola edema and moms previous exp due to the tissue , prior to latching, breast massage, hand express, pre-pump to stretch the nipple / areola complex and reverse pressure stretch back. Mom aware of LC plan.  Mom and dad seemed very surprised the baby latched so well with swallows. LC reassured parents every baby is different and when babies latch early it can be totally different.  Maternal Data Has patient been taught Hand Expression?: Yes Does the patient have breastfeeding experience prior to this delivery?: Yes (per mom  has been leaking) How long did the patient breastfeed?: per mom DL , had to use a NS for 18 months, issues with plug ducts.  Feeding Mother's Current Feeding Choice: Breast Milk  LATCH Score Latch: Grasps breast easily, tongue down, lips flanged, rhythmical sucking.  Audible Swallowing: A few with stimulation  Type of Nipple: Everted at rest and after stimulation  Comfort (Breast/Nipple): Soft / non-tender  Hold (Positioning): Full assist, staff holds infant at breast  LATCH Score: 7   Lactation Tools Discussed/Used Tools:  (LC recommends - shells and hand pump for pre - pumping due to areola edema, areola compressible with swelling.)  Interventions Interventions: Breast feeding basics reviewed;Assisted with latch;Skin to skin;Breast massage;Hand express;Breast compression;Adjust position;Support pillows;Education  Discharge    Consult Status Consult Status:  Follow-up from L&D Date: 03/08/22 Follow-up type: In-patient    Kara Simpson 03/08/2022, 9:39 AM

## 2022-03-08 NOTE — Lactation Note (Signed)
This note was copied from a baby's chart. Lactation Consultation Note  Patient Name: Kara Simpson Date: 03/08/2022 Reason for consult: Initial assessment;Early term 37-38.6wks Age:29 hours Mom stated she just got finished BF the baby. Mom stated the baby is BF well. A whole lot better than her daughter did. Mom stated she BF her daughter now 2 yrs old.  LC asked mom if she would call for the next feeding so LC could see a latch. Mom stated she was getting sore. Maternal Data Does the patient have breastfeeding experience prior to this delivery?: Yes How long did the patient breastfeed?: 15 months  Feeding    LATCH Score Latch: Grasps breast easily, tongue down, lips flanged, rhythmical sucking. (LC didn't see latch. mom stated he done good.)           Hold (Positioning): No assistance needed to correctly position infant at breast.      Lactation Tools Discussed/Used    Interventions Interventions: Breast feeding basics reviewed;LC Services brochure  Discharge    Consult Status Consult Status: Follow-up Date: 03/09/22 Follow-up type: In-patient    Charyl Dancer 03/08/2022, 10:13 PM

## 2022-03-08 NOTE — Progress Notes (Signed)
Called to see pt as per RN cx lip and pt feeling pressure Table set for delivery.  Pt using Nitrous oxide FHT cat I Vitals wnl  [place] block dw pt and agrees.  When I examined her pt was noted to be 5/90%/-2 Vx.  Advised to stop Nitrous and take epidural and rest since exhausted from protracted labor all day. Agrees

## 2022-03-08 NOTE — Anesthesia Preprocedure Evaluation (Signed)
Anesthesia Evaluation  Patient identified by MRN, date of birth, ID band Patient awake    Reviewed: Allergy & Precautions, Patient's Chart, lab work & pertinent test results  History of Anesthesia Complications Negative for: history of anesthetic complications  Airway Mallampati: II  TM Distance: >3 FB Neck ROM: Full    Dental no notable dental hx.    Pulmonary neg pulmonary ROS   Pulmonary exam normal        Cardiovascular negative cardio ROS Normal cardiovascular exam     Neuro/Psych negative neurological ROS  negative psych ROS   GI/Hepatic negative GI ROS, Neg liver ROS,,,  Endo/Other    Renal/GU negative Renal ROS  negative genitourinary   Musculoskeletal negative musculoskeletal ROS (+)    Abdominal   Peds  Hematology negative hematology ROS (+)   Anesthesia Other Findings Day of surgery medications reviewed with patient.  Reproductive/Obstetrics (+) Pregnancy                              Anesthesia Physical Anesthesia Plan  ASA: 2  Anesthesia Plan: Epidural   Post-op Pain Management:    Induction:   PONV Risk Score and Plan: Treatment may vary due to age or medical condition  Airway Management Planned: Natural Airway  Additional Equipment: Fetal Monitoring  Intra-op Plan:   Post-operative Plan:   Informed Consent: I have reviewed the patients History and Physical, chart, labs and discussed the procedure including the risks, benefits and alternatives for the proposed anesthesia with the patient or authorized representative who has indicated his/her understanding and acceptance.       Plan Discussed with:   Anesthesia Plan Comments:          Anesthesia Quick Evaluation

## 2022-03-08 NOTE — Anesthesia Procedure Notes (Signed)
Epidural Patient location during procedure: OB Start time: 03/08/2022 2:18 AM End time: 03/08/2022 2:21 AM  Staffing Anesthesiologist: Kaylyn Layer, MD Performed: anesthesiologist   Preanesthetic Checklist Completed: patient identified, IV checked, risks and benefits discussed, monitors and equipment checked, pre-op evaluation and timeout performed  Epidural Patient position: sitting Prep: DuraPrep and site prepped and draped Patient monitoring: continuous pulse ox, blood pressure and heart rate Approach: midline Location: L3-L4 Injection technique: LOR air  Needle:  Needle type: Tuohy  Needle gauge: 17 G Needle length: 9 cm Needle insertion depth: 6 cm Catheter type: closed end flexible Catheter size: 19 Gauge Catheter at skin depth: 11 cm Test dose: negative and Other (1% lidocaine)  Assessment Events: blood not aspirated, no cerebrospinal fluid, injection not painful, no injection resistance, no paresthesia and negative IV test  Additional Notes Patient identified. Risks, benefits, and alternatives discussed with patient including but not limited to bleeding, infection, nerve damage, paralysis, failed block, incomplete pain control, headache, blood pressure changes, nausea, vomiting, reactions to medication, itching, and postpartum back pain. Confirmed with bedside nurse the patient's most recent platelet count. Confirmed with patient that they are not currently taking any anticoagulation, have any bleeding history, or any family history of bleeding disorders. Patient expressed understanding and wished to proceed. All questions were answered. Sterile technique was used throughout the entire procedure. Please see nursing notes for vital signs.   Crisp LOR on first pass. Test dose was given through epidural catheter and negative prior to continuing to dose epidural or start infusion. Warning signs of high block given to the patient including shortness of breath,  tingling/numbness in hands, complete motor block, or any concerning symptoms with instructions to call for help. Patient was given instructions on fall risk and not to get out of bed. All questions and concerns addressed with instructions to call with any issues or inadequate analgesia.  Reason for block:procedure for pain

## 2022-03-09 ENCOUNTER — Ambulatory Visit: Payer: Self-pay

## 2022-03-09 LAB — CBC
HCT: 33.4 % — ABNORMAL LOW (ref 36.0–46.0)
Hemoglobin: 11.3 g/dL — ABNORMAL LOW (ref 12.0–15.0)
MCH: 29 pg (ref 26.0–34.0)
MCHC: 33.8 g/dL (ref 30.0–36.0)
MCV: 85.6 fL (ref 80.0–100.0)
Platelets: 194 K/uL (ref 150–400)
RBC: 3.9 MIL/uL (ref 3.87–5.11)
RDW: 13.8 % (ref 11.5–15.5)
WBC: 9.4 K/uL (ref 4.0–10.5)
nRBC: 0 % (ref 0.0–0.2)

## 2022-03-09 MED ORDER — IBUPROFEN 600 MG PO TABS: 600.0000 mg | ORAL_TABLET | Freq: Four times a day (QID) | ORAL | 1 refills | Status: AC

## 2022-03-09 MED ORDER — ACETAMINOPHEN 500 MG PO TABS: 1000.0000 mg | ORAL_TABLET | Freq: Four times a day (QID) | ORAL | 1 refills | Status: AC | PRN

## 2022-03-09 NOTE — Lactation Note (Signed)
This note was copied from a baby's chart. Lactation Consultation Note  Patient Name: Kara Simpson [id number] Date: 03/09/2022 Reason for consult: Follow-up assessment;Difficult latch;Early term 37-38.6wks;Breastfeeding assistance;Nipple pain/trauma;Infant weight loss (4.86% WL) Age:29 hours  LC entered the room and the infant was in the bassinet.  Per the birth parent, the latch has been painful.  She stated that she had some soreness and redness on her right nipple.  LC assessed the breast tissue and noted compression stripes and redness on both nipples. The infant began to show feeding cues.  LC assessed the infant's oral cavity.  The infant's labial frenulum may be tight.  LC encouraged the birth parent to work on latching the infant deeply.  LC assisted the birth parent with latching the infant to the right breast in the football position.  LC sandwiched the breast tissue, flipped the out the infant's upper lip, and pulled down on the infant's chin to get a wider gape.  The birth parent stated that the latch felt much better.  LC gave the birth parent breast shells to assist with everting her nipples and to help with soreness.  The birth parent is aware to only wear the shields when she is awake, rinse the sponge with cool water, wash the shells with warm soapy water, and discard the sponges after 24 hours.  The birth parent stated that she does not want to continue with pumping using the DEBP.  LC encouraged the birth parent to use the manual pump if the infant does not latch or if feedings are less than 15 min.  The birth parent will call the RN/LC for assistance with breastfeeding if needed.   Maternal Data    Feeding Mother's Current Feeding Choice: Breast Milk  LATCH Score Latch: Grasps breast easily, tongue down, lips flanged, rhythmical sucking.  Audible Swallowing: Spontaneous and intermittent  Type of Nipple: Flat (Short shafted)  Comfort (Breast/Nipple):  Filling, red/small blisters or bruises, mild/mod discomfort  Hold (Positioning): Assistance needed to correctly position infant at breast and maintain latch.  LATCH Score: 7   Lactation Tools Discussed/Used Tools: Shells  Interventions Interventions: Assisted with latch;Adjust position;Support pillows;Shells  Discharge    Consult Status Consult Status: Follow-up Date: 03/10/22 Follow-up type: In-patient    Kara Simpson P Kara Simpson 03/09/2022, 6:00 PM

## 2022-03-09 NOTE — Discharge Summary (Signed)
OB Discharge Summary  Patient Name: Kara Simpson DOB: [birth year] MRN: [id number]  Date of admission: 03/07/2022 Delivering MD: MODY, VAISHALI   Date of discharge: 03/09/2022  Admitting diagnosis: Normal labor [O80, Z37.9] SVD (spontaneous vaginal delivery) [O80] Intrauterine pregnancy: [redacted]w[redacted]d     Secondary diagnosis:Principal Problem:   Normal labor Active Problems:   SVD (spontaneous vaginal delivery)  Additional problems: None     Discharge diagnosis: Term Pregnancy Delivered                                                                     Post partum procedures: None  Augmentation: Pitocin  Complications: None  Hospital course:  Induction of Labor With Vaginal Delivery   31 y.o. yo G2P2002 at [redacted]w[redacted]d was admitted to the hospital 03/07/2022 for induction of labor.  Indication for induction: PROM.  Patient had an labor course complicated by nothing Membrane Rupture Time/Date: 6:15 AM ,03/07/2022   Delivery Method:Vaginal, Spontaneous  Episiotomy: None  Lacerations:  None  Details of delivery can be found in separate delivery note.  Patient had a postpartum course complicated by nothing. Patient is discharged home 03/09/22.  Newborn Data: Birth date:03/08/2022  Birth time:8:38 AM  Gender:Female  Living status:Living  Apgars:9 ,9  Weight:3459 g   Physical exam  Vitals:   03/08/22 1600 03/08/22 2032 03/08/22 2345 03/09/22 0622  BP: 121/76 (!) 110/58 109/64 115/75  Pulse: 72 66 73 72  Resp: 16 18 18 18   Temp: 99 F (37.2 C) 98 F (36.7 C) 97.6 F (36.4 C) 98.2 F (36.8 C)  TempSrc: Oral Oral Oral Oral  SpO2: 100% 99% 99% 100%  Weight:      Height:       General: alert, cooperative, and no distress Lochia: appropriate Uterine Fundus: firm Incision: N/A DVT Evaluation: No evidence of DVT seen on physical exam. Labs: Lab Results  Component Value Date   WBC 9.4 03/09/2022   HGB 11.3 (L) 03/09/2022   HCT 33.4 (L) 03/09/2022   MCV 85.6  03/09/2022   PLT 194 03/09/2022      Latest Ref Rng & Units 11/03/2017   11:08 PM  CMP  Glucose 70 - 99 mg/dL 01/03/2018   BUN 6 - 20 mg/dL 5   Creatinine 518 - 9.84 mg/dL 2.10   Sodium 3.12 - 811 mmol/L 140   Potassium 3.5 - 5.1 mmol/L 3.6   Chloride 98 - 111 mmol/L 108   CO2 22 - 32 mmol/L 22   Calcium 8.9 - 10.3 mg/dL 8.6   Total Protein 6.5 - 8.1 g/dL 7.1   Total Bilirubin 0.3 - 1.2 mg/dL 0.4   Alkaline Phos 38 - 126 U/L 63   AST 15 - 41 U/L 14   ALT 0 - 44 U/L 14     Discharge instruction: per After Visit Summary and "Baby and Me Booklet".  After Visit Meds:  Allergies as of 03/09/2022       Reactions   Nickel Hives        Medication List     TAKE these medications    acetaminophen 500 MG tablet Commonly known as: TYLENOL Take 2 tablets (1,000 mg total) by mouth every 6 (six) hours as needed (for pain  scale < 4).   benzocaine-Menthol 20-0.5 % Aero Commonly known as: DERMOPLAST Apply 1 application topically as needed for irritation (perineal discomfort).   calcium carbonate 500 MG chewable tablet Commonly known as: TUMS - dosed in mg elemental calcium Chew 1 tablet by mouth daily as needed for indigestion or heartburn.   coconut oil Oil Apply 1 application topically as needed.   ibuprofen 600 MG tablet Commonly known as: ADVIL Take 1 tablet (600 mg total) by mouth every 6 (six) hours.   prenatal multivitamin Tabs tablet Take 1 tablet by mouth daily at 12 noon.        Diet: routine diet  Activity: Advance as tolerated. Pelvic rest for 6 weeks.   Outpatient follow up:6 weeks Follow up Appt:No future appointments. Follow up visit: No follow-ups on file.  Postpartum contraception: Not Discussed  Newborn Data: Live born female  Birth Weight: 7 lb 10 oz (3459 g) APGAR: 9, 9  Newborn Delivery   Birth date/time: 03/08/2022 08:38:26 Delivery type: Vaginal, Spontaneous      Baby Feeding: Breast Disposition:home with mother; circumcision delayed  due to concerns by pediatrician and my evaluation on baby.  Defer circumcision to urology.   03/09/2022 Lendon Colonel, MD

## 2022-03-09 NOTE — Anesthesia Postprocedure Evaluation (Signed)
Anesthesia Post Note  Patient: Kara Simpson  Procedure(s) Performed: AN AD HOC LABOR EPIDURAL     Patient location during evaluation: Mother Baby Anesthesia Type: Epidural Level of consciousness: awake and alert Pain management: pain level controlled Vital Signs Assessment: post-procedure vital signs reviewed and stable Respiratory status: spontaneous breathing, nonlabored ventilation and respiratory function stable Cardiovascular status: stable Postop Assessment: no headache, no backache and epidural receding Anesthetic complications: no   No notable events documented.  Last Vitals:  Vitals:   03/08/22 2345 03/09/22 0622  BP: 109/64 115/75  Pulse: 73 72  Resp: 18 18  Temp: 36.4 C 36.8 C  SpO2: 99% 100%    Last Pain:  Vitals:   03/09/22 0622  TempSrc: Oral  PainSc: 0-No pain   Pain Goal:                   Rica Records

## 2022-03-10 ENCOUNTER — Ambulatory Visit: Payer: Self-pay

## 2022-03-10 NOTE — Lactation Note (Addendum)
This note was copied from a baby's chart. Lactation Consultation Note  Patient Name: Kara Simpson [id number] Date: 03/10/2022 Reason for consult: Follow-up assessment Age:29 hours, -7% weight loss, infant had 8 voids and 12 stools since birth. Birth Parent recently latched infant at the breast prior to [facility] entering the room, LC observed Birth Parent had breast fullness and easily expressed 13 mls on her right breast and  was still using hand pump when LC left the room, infant given 3 mls using foley cup and was content. LC re-fitted Birth Parent will size 21 mm breast flange which was a better fit. Birth Parent plans to continue to BF infant according to hunger cues on demand, 8 to 12+ times within 24 hours, STS. Birth Parent knows if infant is still cuing after latching on 1st breast to offer the 2nd breast during the feeding. Birth Parent plans to supplement infant with her own  EBM after latching infant at the breast to help stabilize infant's weight loss. Plans only to use hand pump for now. LC discussed discharge education: LC discussed engorgement prevention and treatment, Birth Parent knows if breast are feeling full to pump for softness if not time to latching infant at the breast, LC discussed infant's input and output, how do you know breastfeeding is going well and warning signs of dehydration in infant. Birth Parent knows EBM is safe for 4 hours at room temperature, 4 days in fridge, 6 months in top of fridge and 12 months in deep freezer at 0 celsius. LC reviewed O/P services, breastfeeding support groups, community resources, and our phone # for post-discharge questions with Birth Parent. Infant has follow up appointment in morning with Pediatrician, MD.   Maternal Data    Feeding Mother's Current Feeding Choice: Breast Milk  LATCH Score                    Lactation Tools Discussed/Used Tools: Pump Breast pump type: Manual Pump Education: Milk Storage;Setup,  frequency, and cleaning Pumped volume: 13 mL (Birth Parent was still pumping when LC left the room.)  Interventions Interventions: Position options;Skin to skin;Hand pump;Pace feeding;Education;DEBP  Discharge Discharge Education: Engorgement and breast care;Warning signs for feeding baby Pump: DEBP;Hands Free (Birth Parent has two DEBP's Mom Cozy and Medela Flex)  Consult Status Consult Status: Complete Date: 03/10/22 Follow-up type: Physician    Frederico Hamman 03/10/2022, 12:53 PM

## 2022-03-20 ENCOUNTER — Telehealth (HOSPITAL_COMMUNITY): Payer: Self-pay | Admitting: *Deleted

## 2022-03-20 NOTE — Telephone Encounter (Signed)
Left phone voicemail message.  Duffy Rhody, RN 03-20-2022 at 1:39pm

## 2022-05-03 ENCOUNTER — Telehealth: Payer: Commercial Managed Care - PPO | Admitting: Physician Assistant

## 2022-05-03 DIAGNOSIS — J019 Acute sinusitis, unspecified: Secondary | ICD-10-CM

## 2022-05-03 DIAGNOSIS — B9789 Other viral agents as the cause of diseases classified elsewhere: Secondary | ICD-10-CM

## 2022-05-03 MED ORDER — AMOXICILLIN-POT CLAVULANATE 875-125 MG PO TABS: 1.0000 | ORAL_TABLET | Freq: Two times a day (BID) | ORAL | 0 refills | Status: AC

## 2022-05-03 MED ORDER — FLUTICASONE PROPIONATE 50 MCG/ACT NA SUSP: 2.0000 | Freq: Every day | NASAL | 0 refills | Status: AC

## 2022-05-03 NOTE — Progress Notes (Signed)
E-Visit for Sinus Problems  We are sorry that you are not feeling well.  Here is how we plan to help!  Based on what you have shared with me it looks like you have sinusitis.  Sinusitis is inflammation and infection in the sinus cavities of the head.  Based on your presentation I believe you most likely have Acute Viral Sinusitis.This is an infection most likely caused by a virus. There is not specific treatment for viral sinusitis other than to help you with the symptoms until the infection runs its course.  You may use an oral decongestant such as Mucinex D or if you have glaucoma or high blood pressure use plain Mucinex. Saline nasal spray help and can safely be used as often as needed for congestion, I have prescribed: Fluticasone nasal spray two sprays in each nostril once a day  Some authorities believe that zinc sprays or the use of Echinacea may shorten the course of your symptoms.  Sinus infections are not as easily transmitted as other respiratory infection, however we still recommend that you avoid close contact with loved ones, especially the very young and elderly.  Remember to wash your hands thoroughly throughout the day as this is the number one way to prevent the spread of infection!  I would highly recommend to take an at home Covid 19 test, if desired, since you are having a high fever with symptoms that could be consistent with Covid.   Home Care: Only take medications as instructed by your medical team. Do not take these medications with alcohol. A steam or ultrasonic humidifier can help congestion.  You can place a towel over your head and breathe in the steam from hot water coming from a faucet. Avoid close contacts especially the very young and the elderly. Cover your mouth when you cough or sneeze. Always remember to wash your hands.  Get Help Right Away If: You develop worsening fever or sinus pain. You develop a severe head ache or visual changes. Your symptoms  persist after you have completed your treatment plan.  Make sure you Understand these instructions. Will watch your condition. Will get help right away if you are not doing well or get worse.   Thank you for choosing an e-visit.  Your e-visit answers were reviewed by a board certified advanced clinical practitioner to complete your personal care plan. Depending upon the condition, your plan could have included both over the counter or prescription medications.  Please review your pharmacy choice. Make sure the pharmacy is open so you can pick up prescription now. If there is a problem, you may contact your provider through [employer] and have the prescription routed to another pharmacy.  Your safety is important to [place]. If you have drug allergies check your prescription carefully.   For the next 24 hours you can use MyChart to ask questions about today's visit, request a non-urgent call back, or ask for a work or school excuse. You will get an email in the next two days asking about your experience. I hope that your e-visit has been valuable and will speed your recovery.  I have spent 5 minutes in review of e-visit questionnaire, review and updating patient chart, medical decision making and response to patient.   Mar Daring, PA-C

## 2022-05-03 NOTE — Progress Notes (Signed)
E-Visit for Sinus Problems  We are sorry that you are not feeling well.  Here is how we plan to help!  Based on what you have shared with me it looks like you have sinusitis.  Sinusitis is inflammation and infection in the sinus cavities of the head.  Based on your presentation I believe you most likely have Acute Bacterial Sinusitis.  This is an infection caused by bacteria and is treated with antibiotics. I have prescribed Augmentin 871m/125mg one tablet twice daily with food, for 7 days. You may use an oral decongestant such as Mucinex D or if you have glaucoma or high blood pressure use plain Mucinex. Saline nasal spray help and can safely be used as often as needed for congestion.  If you develop worsening sinus pain, fever or notice severe headache and vision changes, or if symptoms are not better after completion of antibiotic, please schedule an appointment with a health care provider.    Sinus infections are not as easily transmitted as other respiratory infection, however we still recommend that you avoid close contact with loved ones, especially the very young and elderly.  Remember to wash your hands thoroughly throughout the day as this is the number one way to prevent the spread of infection!  Home Care: Only take medications as instructed by your medical team. Complete the entire course of an antibiotic. Do not take these medications with alcohol. A steam or ultrasonic humidifier can help congestion.  You can place a towel over your head and breathe in the steam from hot water coming from a faucet. Avoid close contacts especially the very young and the elderly. Cover your mouth when you cough or sneeze. Always remember to wash your hands.  Get Help Right Away If: You develop worsening fever or sinus pain. You develop a severe head ache or visual changes. Your symptoms persist after you have completed your treatment plan.  Make sure you Understand these instructions. Will watch  your condition. Will get help right away if you are not doing well or get worse.  Thank you for choosing an e-visit.  Your e-visit answers were reviewed by a board certified advanced clinical practitioner to complete your personal care plan. Depending upon the condition, your plan could have included both over the counter or prescription medications.  Please review your pharmacy choice. Make sure the pharmacy is open so you can pick up prescription now. If there is a problem, you may contact your provider through [employer] have the prescription routed to another pharmacy.  Your safety is important to [place] If you have drug allergies check your prescription carefully.   For the next 24 hours you can use MyChart to ask questions about today's visit, request a non-urgent call back, or ask for a work or school excuse. You will get an email in the next two days asking about your experience. I hope that your e-visit has been valuable and will speed your recovery.   Meds ordered this encounter  Medications   fluticasone (FLONASE) 50 MCG/ACT nasal spray    Sig: Place 2 sprays into both nostrils daily.    Dispense:  16 g    Refill:  0    Order Specific Question:   Supervising Provider    Answer:   LChase Picket[[id number]  amoxicillin-clavulanate (AUGMENTIN) 875-125 MG tablet    Sig: Take 1 tablet by mouth 2 (two) times daily for 7 days.    Dispense:  14 tablet  Refill:  0     I spent approximately 7 minutes reviewing the patient's history, current symptoms and coordinating their plan of care today.

## 2022-05-03 NOTE — Addendum Note (Signed)
Addended by: Apolonio Schneiders E on: 05/03/2022 03:00 PM   Modules accepted: Orders
